# Patient Record
Sex: Male | Born: 1960 | Race: Black or African American | Hispanic: No | Marital: Married | Smoking: Never smoker
Health system: Southern US, Community
[De-identification: ages and names within clinical notes are randomized; demographics above are authoritative.]

## PROBLEM LIST (undated history)

## (undated) DIAGNOSIS — E669 Obesity, unspecified: Secondary | ICD-10-CM

## (undated) DIAGNOSIS — G4733 Obstructive sleep apnea (adult) (pediatric): Secondary | ICD-10-CM

## (undated) DIAGNOSIS — E7800 Pure hypercholesterolemia, unspecified: Secondary | ICD-10-CM

## (undated) DIAGNOSIS — R55 Syncope and collapse: Secondary | ICD-10-CM

## (undated) DIAGNOSIS — Z9989 Dependence on other enabling machines and devices: Secondary | ICD-10-CM

## (undated) DIAGNOSIS — I10 Essential (primary) hypertension: Secondary | ICD-10-CM

## (undated) HISTORY — PX: BACK SURGERY: SHX140

---

## 2019-02-27 ENCOUNTER — Other Ambulatory Visit (HOSPITAL_COMMUNITY): Payer: Medicare Other

## 2019-02-27 ENCOUNTER — Emergency Department (HOSPITAL_COMMUNITY): Payer: Medicare Other

## 2019-02-27 ENCOUNTER — Encounter (HOSPITAL_COMMUNITY): Payer: Self-pay | Admitting: Emergency Medicine

## 2019-02-27 ENCOUNTER — Other Ambulatory Visit: Payer: Self-pay

## 2019-02-27 ENCOUNTER — Inpatient Hospital Stay (HOSPITAL_COMMUNITY)
Admission: EM | Admit: 2019-02-27 | Discharge: 2019-03-02 | DRG: 280 | Disposition: A | Discharge status: Home / Self Care | Payer: Medicare Other | Attending: Cardiology | Admitting: Cardiology

## 2019-02-27 DIAGNOSIS — I200 Unstable angina: Secondary | ICD-10-CM

## 2019-02-27 DIAGNOSIS — E878 Other disorders of electrolyte and fluid balance, not elsewhere classified: Secondary | ICD-10-CM | POA: Diagnosis present

## 2019-02-27 DIAGNOSIS — I25110 Atherosclerotic heart disease of native coronary artery with unstable angina pectoris: Secondary | ICD-10-CM | POA: Diagnosis present

## 2019-02-27 DIAGNOSIS — R55 Syncope and collapse: Secondary | ICD-10-CM

## 2019-02-27 DIAGNOSIS — E876 Hypokalemia: Secondary | ICD-10-CM

## 2019-02-27 DIAGNOSIS — E669 Obesity, unspecified: Secondary | ICD-10-CM | POA: Diagnosis present

## 2019-02-27 DIAGNOSIS — I083 Combined rheumatic disorders of mitral, aortic and tricuspid valves: Secondary | ICD-10-CM | POA: Diagnosis present

## 2019-02-27 DIAGNOSIS — E860 Dehydration: Secondary | ICD-10-CM | POA: Diagnosis present

## 2019-02-27 DIAGNOSIS — I214 Non-ST elevation (NSTEMI) myocardial infarction: Principal | ICD-10-CM

## 2019-02-27 DIAGNOSIS — I9589 Other hypotension: Secondary | ICD-10-CM | POA: Diagnosis not present

## 2019-02-27 DIAGNOSIS — Z79899 Other long term (current) drug therapy: Secondary | ICD-10-CM

## 2019-02-27 DIAGNOSIS — R778 Other specified abnormalities of plasma proteins: Secondary | ICD-10-CM

## 2019-02-27 DIAGNOSIS — D649 Anemia, unspecified: Secondary | ICD-10-CM | POA: Diagnosis present

## 2019-02-27 DIAGNOSIS — Z6839 Body mass index (BMI) 39.0-39.9, adult: Secondary | ICD-10-CM

## 2019-02-27 DIAGNOSIS — I340 Nonrheumatic mitral (valve) insufficiency: Secondary | ICD-10-CM

## 2019-02-27 DIAGNOSIS — E870 Hyperosmolality and hypernatremia: Secondary | ICD-10-CM | POA: Diagnosis present

## 2019-02-27 DIAGNOSIS — R7989 Other specified abnormal findings of blood chemistry: Secondary | ICD-10-CM | POA: Diagnosis not present

## 2019-02-27 DIAGNOSIS — G4733 Obstructive sleep apnea (adult) (pediatric): Secondary | ICD-10-CM | POA: Diagnosis present

## 2019-02-27 DIAGNOSIS — E785 Hyperlipidemia, unspecified: Secondary | ICD-10-CM | POA: Diagnosis present

## 2019-02-27 DIAGNOSIS — Z7982 Long term (current) use of aspirin: Secondary | ICD-10-CM

## 2019-02-27 DIAGNOSIS — I959 Hypotension, unspecified: Secondary | ICD-10-CM | POA: Diagnosis present

## 2019-02-27 DIAGNOSIS — I5031 Acute diastolic (congestive) heart failure: Secondary | ICD-10-CM | POA: Diagnosis not present

## 2019-02-27 DIAGNOSIS — R197 Diarrhea, unspecified: Secondary | ICD-10-CM | POA: Diagnosis present

## 2019-02-27 DIAGNOSIS — R079 Chest pain, unspecified: Secondary | ICD-10-CM

## 2019-02-27 DIAGNOSIS — I110 Hypertensive heart disease with heart failure: Secondary | ICD-10-CM | POA: Diagnosis present

## 2019-02-27 DIAGNOSIS — Z79891 Long term (current) use of opiate analgesic: Secondary | ICD-10-CM

## 2019-02-27 DIAGNOSIS — Z7951 Long term (current) use of inhaled steroids: Secondary | ICD-10-CM | POA: Diagnosis not present

## 2019-02-27 DIAGNOSIS — I429 Cardiomyopathy, unspecified: Secondary | ICD-10-CM | POA: Diagnosis not present

## 2019-02-27 DIAGNOSIS — E861 Hypovolemia: Secondary | ICD-10-CM

## 2019-02-27 HISTORY — DX: Obstructive sleep apnea (adult) (pediatric): G47.33

## 2019-02-27 HISTORY — DX: Obesity, unspecified: E66.9

## 2019-02-27 HISTORY — DX: Pure hypercholesterolemia, unspecified: E78.00

## 2019-02-27 HISTORY — DX: Essential (primary) hypertension: I10

## 2019-02-27 HISTORY — DX: Dependence on other enabling machines and devices: Z99.89

## 2019-02-27 HISTORY — DX: Syncope and collapse: R55

## 2019-02-27 LAB — RETICULOCYTES
Immature Retic Fract: 6.4 % (ref 2.3–15.9)
RBC.: 4.15 MIL/uL — ABNORMAL LOW (ref 4.22–5.81)
Retic Count, Absolute: 66 10*3/uL (ref 19.0–186.0)
Retic Ct Pct: 1.6 % (ref 0.4–3.1)

## 2019-02-27 LAB — BASIC METABOLIC PANEL
Anion gap: 8 (ref 5–15)
BUN: 15 mg/dL (ref 6–20)
CO2: 26 mmol/L (ref 22–32)
Calcium: 8.9 mg/dL (ref 8.9–10.3)
Chloride: 108 mmol/L (ref 98–111)
Creatinine, Ser: 0.88 mg/dL (ref 0.61–1.24)
GFR calc Af Amer: >60 mL/min (ref >60)
GLUCOSE: 116 mg/dL — AB (ref 70–99)
Potassium: 3.5 mmol/L (ref 3.5–5.1)
Sodium: 142 mmol/L (ref 135–145)

## 2019-02-27 LAB — CBC WITH DIFFERENTIAL/PLATELET
Abs Immature Granulocytes: 0.05 10*3/uL (ref 0.00–0.07)
BASOS ABS: 0 10*3/uL (ref 0.0–0.1)
Basophils Relative: 1 %
EOS ABS: 0.1 10*3/uL (ref 0.0–0.5)
Eosinophils Relative: 2 %
HCT: 30.7 % — ABNORMAL LOW (ref 39.0–52.0)
Hemoglobin: 9.6 g/dL — ABNORMAL LOW (ref 13.0–17.0)
Immature Granulocytes: 1 %
Lymphocytes Relative: 19 %
Lymphs Abs: 1.3 10*3/uL (ref 0.7–4.0)
MCH: 27.6 pg (ref 26.0–34.0)
MCHC: 31.3 g/dL (ref 30.0–36.0)
MCV: 88.2 fL (ref 80.0–100.0)
Monocytes Absolute: 0.6 10*3/uL (ref 0.1–1.0)
Monocytes Relative: 8 %
NRBC: 0 % (ref 0.0–0.2)
Neutro Abs: 4.8 10*3/uL (ref 1.7–7.7)
Neutrophils Relative %: 69 %
Platelets: 170 10*3/uL (ref 150–400)
RBC: 3.48 MIL/uL — ABNORMAL LOW (ref 4.22–5.81)
RDW: 13.1 % (ref 11.5–15.5)
WBC: 6.9 10*3/uL (ref 4.0–10.5)

## 2019-02-27 LAB — URINALYSIS, ROUTINE W REFLEX MICROSCOPIC
Bilirubin Urine: NEGATIVE
Glucose, UA: NEGATIVE mg/dL
Hgb urine dipstick: NEGATIVE
Ketones, ur: NEGATIVE mg/dL
Leukocytes,Ua: NEGATIVE
Nitrite: NEGATIVE
PROTEIN: 100 mg/dL — AB
Specific Gravity, Urine: 1.03 (ref 1.005–1.030)
pH: 6 (ref 5.0–8.0)

## 2019-02-27 LAB — COMPREHENSIVE METABOLIC PANEL
ALT: 41 U/L (ref 0–44)
ANION GAP: 46 — AB (ref 5–15)
AST: 34 U/L (ref 15–41)
Albumin: 2.9 g/dL — ABNORMAL LOW (ref 3.5–5.0)
Alkaline Phosphatase: 50 U/L (ref 38–126)
BUN: 15 mg/dL (ref 6–20)
CO2: 17 mmol/L — ABNORMAL LOW (ref 22–32)
Calcium: 5.8 mg/dL — CL (ref 8.9–10.3)
Chloride: 94 mmol/L — ABNORMAL LOW (ref 98–111)
Creatinine, Ser: 0.8 mg/dL (ref 0.61–1.24)
GFR calc Af Amer: >60 mL/min (ref >60)
GFR calc non Af Amer: >60 mL/min (ref >60)
Glucose, Bld: 116 mg/dL — ABNORMAL HIGH (ref 70–99)
Potassium: 2.6 mmol/L — CL (ref 3.5–5.1)
Sodium: 157 mmol/L — ABNORMAL HIGH (ref 135–145)
TOTAL PROTEIN: 5 g/dL — AB (ref 6.5–8.1)
Total Bilirubin: 0.6 mg/dL (ref 0.3–1.2)

## 2019-02-27 LAB — CBC
HCT: 35.5 % — ABNORMAL LOW (ref 39.0–52.0)
Hemoglobin: 11.5 g/dL — ABNORMAL LOW (ref 13.0–17.0)
MCH: 27.7 pg (ref 26.0–34.0)
MCHC: 32.4 g/dL (ref 30.0–36.0)
MCV: 85.5 fL (ref 80.0–100.0)
NRBC: 0 % (ref 0.0–0.2)
Platelets: 231 10*3/uL (ref 150–400)
RBC: 4.15 MIL/uL — ABNORMAL LOW (ref 4.22–5.81)
RDW: 13.1 % (ref 11.5–15.5)
WBC: 8.6 10*3/uL (ref 4.0–10.5)

## 2019-02-27 LAB — TROPONIN I
Troponin I: 0.49 ng/mL (ref <0.03)
Troponin I: 1.74 ng/mL (ref <0.03)

## 2019-02-27 LAB — VITAMIN B12: VITAMIN B 12: 168 pg/mL — AB (ref 180–914)

## 2019-02-27 LAB — TSH: TSH: 0.931 u[IU]/mL (ref 0.350–4.500)

## 2019-02-27 LAB — ECHOCARDIOGRAM COMPLETE
Height: 65 in
Weight: 3680 oz

## 2019-02-27 LAB — HEPARIN LEVEL (UNFRACTIONATED): Heparin Unfractionated: 0.11 IU/mL — ABNORMAL LOW (ref 0.30–0.70)

## 2019-02-27 LAB — IRON AND TIBC
Iron: 114 ug/dL (ref 45–182)
Saturation Ratios: 49 % — ABNORMAL HIGH (ref 17.9–39.5)
TIBC: 231 ug/dL — ABNORMAL LOW (ref 250–450)
UIBC: 117 ug/dL

## 2019-02-27 LAB — FOLATE: FOLATE: 7 ng/mL (ref >5.9)

## 2019-02-27 LAB — I-STAT TROPONIN, ED: Troponin i, poc: 0.21 ng/mL (ref 0.00–0.08)

## 2019-02-27 LAB — MAGNESIUM: Magnesium: 1.6 mg/dL — ABNORMAL LOW (ref 1.7–2.4)

## 2019-02-27 LAB — CBG MONITORING, ED: GLUCOSE-CAPILLARY: 109 mg/dL — AB (ref 70–99)

## 2019-02-27 LAB — FERRITIN: Ferritin: 112 ng/mL (ref 24–336)

## 2019-02-27 MED ORDER — POTASSIUM CHLORIDE CRYS ER 20 MEQ PO TBCR
40.0000 meq | EXTENDED_RELEASE_TABLET | Freq: Once | ORAL | Status: AC
  Administered 2019-02-27: 40 meq via ORAL
  Filled 2019-02-27: qty 2

## 2019-02-27 MED ORDER — MAGNESIUM SULFATE 2 GM/50ML IV SOLN
2.0000 g | Freq: Once | INTRAVENOUS | Status: AC
  Administered 2019-02-27: 2 g via INTRAVENOUS
  Filled 2019-02-27: qty 50

## 2019-02-27 MED ORDER — SODIUM CHLORIDE 0.9% FLUSH: 3.0000 mL | INTRAVENOUS | Status: DC | PRN

## 2019-02-27 MED ORDER — SIMVASTATIN 20 MG PO TABS
20.0000 mg | ORAL_TABLET | Freq: Every day | ORAL | Status: DC
  Administered 2019-02-28 – 2019-03-01 (×2): 20 mg via ORAL
  Filled 2019-02-27 (×2): qty 1

## 2019-02-27 MED ORDER — SODIUM CHLORIDE 0.9 % IV BOLUS
500.0000 mL | Freq: Once | INTRAVENOUS | Status: AC
  Administered 2019-02-27: 500 mL via INTRAVENOUS

## 2019-02-27 MED ORDER — ASPIRIN 81 MG PO CHEW
324.0000 mg | CHEWABLE_TABLET | Freq: Once | ORAL | Status: AC
  Administered 2019-02-27: 324 mg via ORAL
  Filled 2019-02-27: qty 4

## 2019-02-27 MED ORDER — HEPARIN (PORCINE) 25000 UT/250ML-% IV SOLN
1450.0000 [IU]/h | INTRAVENOUS | Status: DC
  Administered 2019-02-27: 1200 [IU]/h via INTRAVENOUS
  Administered 2019-02-28: 1450 [IU]/h via INTRAVENOUS
  Filled 2019-02-27 (×2): qty 250

## 2019-02-27 MED ORDER — METOPROLOL TARTRATE 25 MG PO TABS: 25.0000 mg | ORAL_TABLET | Freq: Once | ORAL | Status: DC

## 2019-02-27 MED ORDER — ONDANSETRON HCL 4 MG/2ML IJ SOLN
4.0000 mg | Freq: Four times a day (QID) | INTRAMUSCULAR | Status: DC | PRN
  Administered 2019-03-01: 4 mg via INTRAVENOUS
  Filled 2019-02-27: qty 2

## 2019-02-27 MED ORDER — HEPARIN BOLUS VIA INFUSION
4000.0000 [IU] | Freq: Once | INTRAVENOUS | Status: AC
  Administered 2019-02-27: 4000 [IU] via INTRAVENOUS
  Filled 2019-02-27: qty 4000

## 2019-02-27 MED ORDER — SODIUM CHLORIDE 0.9 % IV SOLN
INTRAVENOUS | Status: DC
  Administered 2019-02-28: 05:00:00 via INTRAVENOUS

## 2019-02-27 MED ORDER — ACETAMINOPHEN 325 MG PO TABS
650.0000 mg | ORAL_TABLET | ORAL | Status: DC | PRN
  Administered 2019-02-28: 650 mg via ORAL
  Filled 2019-02-27: qty 2

## 2019-02-27 MED ORDER — SODIUM CHLORIDE 0.9% FLUSH
3.0000 mL | Freq: Two times a day (BID) | INTRAVENOUS | Status: DC
  Administered 2019-02-28 – 2019-03-01 (×3): 3 mL via INTRAVENOUS

## 2019-02-27 MED ORDER — SODIUM CHLORIDE 0.9 % IV SOLN: 250.0000 mL | INTRAVENOUS | Status: DC | PRN

## 2019-02-27 MED ORDER — FLUTICASONE PROPIONATE 50 MCG/ACT NA SUSP
2.0000 | Freq: Every day | NASAL | Status: DC
  Administered 2019-02-28 – 2019-03-02 (×3): 2 via NASAL
  Filled 2019-02-27: qty 16

## 2019-02-27 MED ORDER — SODIUM CHLORIDE 0.9% FLUSH: 3.0000 mL | Freq: Two times a day (BID) | INTRAVENOUS | Status: DC

## 2019-02-27 MED ORDER — POTASSIUM CHLORIDE CRYS ER 20 MEQ PO TBCR: 20.0000 meq | EXTENDED_RELEASE_TABLET | Freq: Once | ORAL | Status: DC

## 2019-02-27 MED ORDER — ASPIRIN EC 81 MG PO TBEC
81.0000 mg | DELAYED_RELEASE_TABLET | Freq: Every day | ORAL | Status: DC
  Administered 2019-02-28 – 2019-03-02 (×3): 81 mg via ORAL
  Filled 2019-02-27 (×3): qty 1

## 2019-02-27 MED ORDER — POTASSIUM CHLORIDE 10 MEQ/100ML IV SOLN
10.0000 meq | Freq: Once | INTRAVENOUS | Status: AC
  Administered 2019-02-27: 10 meq via INTRAVENOUS
  Filled 2019-02-27: qty 100

## 2019-02-27 MED ORDER — ASPIRIN 81 MG PO CHEW
81.0000 mg | CHEWABLE_TABLET | ORAL | Status: AC
  Administered 2019-02-28: 81 mg via ORAL
  Filled 2019-02-27: qty 1

## 2019-02-27 MED ORDER — ADULT MULTIVITAMIN W/MINERALS CH
1.0000 | ORAL_TABLET | Freq: Every day | ORAL | Status: DC
  Administered 2019-02-28 – 2019-03-02 (×3): 1 via ORAL
  Filled 2019-02-27 (×3): qty 1

## 2019-02-27 MED ORDER — ONDANSETRON HCL 4 MG/2ML IJ SOLN
4.0000 mg | Freq: Once | INTRAMUSCULAR | Status: AC
  Administered 2019-02-27: 4 mg via INTRAVENOUS
  Filled 2019-02-27: qty 2

## 2019-02-27 MED ORDER — VITAMIN D 25 MCG (1000 UNIT) PO TABS
1000.0000 [IU] | ORAL_TABLET | Freq: Every day | ORAL | Status: DC
  Administered 2019-02-28 – 2019-03-02 (×3): 1000 [IU] via ORAL
  Filled 2019-02-27 (×3): qty 1

## 2019-02-27 MED ORDER — SODIUM CHLORIDE 0.9 % IV BOLUS
1000.0000 mL | Freq: Once | INTRAVENOUS | Status: AC
  Administered 2019-02-27: 1000 mL via INTRAVENOUS

## 2019-02-27 NOTE — Progress Notes (Signed)
ANTICOAGULATION CONSULT NOTE  Pharmacy Consult for Heparin Indication: chest pain/ACS  No Known Allergies  Patient Measurements: Height: 5\' 5"  (165.1 cm) Weight: 244 lb 11.2 oz (111 kg) IBW/kg (Calculated) : 61.5 Heparin Dosing Weight:  85.2 kg  Vital Signs: Temp: 98 F (36.7 C) (03/08 2031) Temp Source: Oral (03/08 2031) BP: 112/62 (03/08 2031) Pulse Rate: 79 (03/08 2031)  Labs: Recent Labs    02/27/19 0952 02/27/19 1112 02/27/19 1836 02/27/19 2056  HGB 9.6*  --  11.5*  --   HCT 30.7*  --  35.5*  --   PLT 170  --  231  --   HEPARINUNFRC  --   --   --  0.11*  CREATININE 0.80 0.88  --   --   TROPONINI  --  0.49* 1.74*  --     Estimated Creatinine Clearance: 109.1 mL/min (by C-G formula based on SCr of 0.88 mg/dL).   Medical History: Past Medical History:  Diagnosis Date  . High cholesterol   . Hypertension   . Obesity   . Obstructive sleep apnea on CPAP   . Syncope    Assessment: 57 yom presenting with CP and syncope, + troponins. Of note, has hx of syncope, HTN, HLD, obesity, OSA   Significant events: Dehydration with Similar episode 9/19. Negative cardiac w/u at that time.  Heparin level tonight came back subtherapeutic at 0.11, on 1200 units/hr. Hgb 11.5, plt 231. Troponin increased from 0.21>0.49>1.74. No s/sx of bleeding. No infusion issues.   Goal of Therapy:  Heparin level 0.3-0.7 units/ml Monitor platelets by anticoagulation protocol: Yes   Plan:  Increase heparin infusion to 1450 units/hr Check heparin level in 6 hrs Daily HL and CBC  Sherron Monday, PharmD, BCCCP Clinical Pharmacist  Pager: [phone] Phone: [phone] 02/27/2019,9:49 PM

## 2019-02-27 NOTE — Consult Note (Deleted)
See admit note.

## 2019-02-27 NOTE — Progress Notes (Addendum)
   UPDATE:  Dr. Delton See reviewed echo, EF 40-45%. Given syncope, decreased EF and elevated troponin, she recommends to cancel plan for CT and instead proceed with LHC tomorrow. I called into patient's room and discussed echo results and recommendation for cath with him. Risks and benefits of cardiac catheterization have been discussed with the patient.  These include bleeding, infection, kidney damage, stroke, heart attack, death. The patient understands these risks and is willing to proceed. He denies any known allergies. Given LV dysfunction, EF 40-45% and having already received IV fluids, will give 75cc/hr NS starting tomorrow AM with cath orders. I wrote his name on our add-on board so timing of cath will be determined tomorrow. OK to eat tonight. Also updated nurse of plan.  Dayna Dunn PA-C

## 2019-02-27 NOTE — H&P (Addendum)
It appears ED order for internal medicine to admit was cancelled and Dr.Nelson is now listed as attending. We were initially unaware we were consulting as IM was being asked to admit. Spoke with Dr. Jeraldine Loots to clarify, unclear how IM admit order got discontinued. We will go ahead and admit to our service. See Consult note which will serve as this pt's H/P. Dr. Delton See also recommends to supplement K and Mag (see orders). She is unable to do CT today as pt's BP prohibits BB/NTG for test but she plans to discuss with rounding team tomorrow to hopefully facilitate since his antihypertensives are being held. She also ordered additional IV fluid. Will continue to cycle troponins and also obtain repeat CBC with next labs. Add anemia panel. Dayna Dunn PA-C

## 2019-02-27 NOTE — Progress Notes (Signed)
Echocardiogram 2D Echocardiogram has been performed.  Pieter Partridge 02/27/2019, 3:48 PM

## 2019-02-27 NOTE — ED Notes (Signed)
ED TO INPATIENT HANDOFF REPORT  ED Nurse Name and Phone #: Lorin Picket [phone]  S Name/Age/Gender Derrick Stark 58 y.o. male Room/Bed: 028C/028C  Code Status   Code Status: Not on file  Home/SNF/Other Home Patient oriented to: self, place, time and situation Is this baseline? Yes   Triage Complete: Triage complete  Chief Complaint syncope/weakness  [place] [facility] [place] here from [place] with kids basketball tournament.  Felt faint feeling and dizzy, sat on a bench, passed out- states that he had chest pain prior to syncopal episode. Had a similar episode on football field in September, states was dehydrated.  On arrival pt denies any chest pain, c/o nausea-- has not eaten breakfast today.    Allergies No Known Allergies  Level of Care/Admitting Diagnosis ED Disposition    ED Disposition Condition Comment   Admit  Hospital Area: [facility] [[id number]]  Level of Care: Progressive [102]  Diagnosis: Syncope [206001]  Admitting Physician: Lars Masson [[id number]]  Attending Physician: Lars Masson [[id number]]  Estimated length of stay: past midnight tomorrow  Certification:: I certify this patient will need inpatient services for at least 2 midnights  PT Class (Do Not Modify): Inpatient [101]  PT Acc Code (Do Not Modify): Private [1]       B Medical/Surgery History Past Medical History:  Diagnosis Date  . High cholesterol   . Hypertension   . Obesity   . Obstructive sleep apnea on CPAP   . Syncope    Past Surgical History:  Procedure Laterality Date  . BACK SURGERY       A IV Location/Drains/Wounds Patient Lines/Drains/Airways Status   Active Line/Drains/Airways    Name:   Placement date:   Placement time:   Site:   Days:   Peripheral IV 02/27/19 Left Antecubital   02/27/19    0918    Antecubital   less than 1          Intake/Output Last 24 hours No intake or output data in the 24 hours ending  02/27/19 1638  Labs/Imaging Results for orders placed or performed during the hospital encounter of 02/27/19 (from the past 48 hour(s))  CBC with Differential     Status: Abnormal   Collection Time: 02/27/19  9:52 AM  Result Value Ref Range   WBC 6.9 4.0 - 10.5 K/uL   RBC 3.48 (L) 4.22 - 5.81 MIL/uL   Hemoglobin 9.6 (L) 13.0 - 17.0 g/dL   HCT 22.2 (L) 97.9 - 89.2 %   MCV 88.2 80.0 - 100.0 fL   MCH 27.6 26.0 - 34.0 pg   MCHC 31.3 30.0 - 36.0 g/dL   RDW 11.9 41.7 - 40.8 %   Platelets 170 150 - 400 K/uL   nRBC 0.0 0.0 - 0.2 %   Neutrophils Relative % 69 %   Neutro Abs 4.8 1.7 - 7.7 K/uL   Lymphocytes Relative 19 %   Lymphs Abs 1.3 0.7 - 4.0 K/uL   Monocytes Relative 8 %   Monocytes Absolute 0.6 0.1 - 1.0 K/uL   Eosinophils Relative 2 %   Eosinophils Absolute 0.1 0.0 - 0.5 K/uL   Basophils Relative 1 %   Basophils Absolute 0.0 0.0 - 0.1 K/uL   Immature Granulocytes 1 %   Abs Immature Granulocytes 0.05 0.00 - 0.07 K/uL    Comment: Performed at [facility], [address] [place], [place] [address]  Comprehensive metabolic panel     Status:  Abnormal   Collection Time: 02/27/19  9:52 AM  Result Value Ref Range   Sodium 157 (H) 135 - 145 mmol/L   Potassium 2.6 (LL) 3.5 - 5.1 mmol/L    Comment: CRITICAL RESULT CALLED TO, READ BACK BY AND VERIFIED WITH: MCORD, S RN @ 1049 ON 02/27/2019 BY TEMOCHE,H    Chloride 94 (L) 98 - 111 mmol/L   CO2 17 (L) 22 - 32 mmol/L   Glucose, Bld 116 (H) 70 - 99 mg/dL   BUN 15 6 - 20 mg/dL   Creatinine, Ser 1.61 0.61 - 1.24 mg/dL   Calcium 5.8 (LL) 8.9 - 10.3 mg/dL    Comment: CRITICAL RESULT CALLED TO, READ BACK BY AND VERIFIED WITH: MCORD, S RN @ 1049 ON 02/27/2019 BY TEMOCHE,H    Total Protein 5.0 (L) 6.5 - 8.1 g/dL   Albumin 2.9 (L) 3.5 - 5.0 g/dL   AST 34 15 - 41 U/L   ALT 41 0 - 44 U/L   Alkaline Phosphatase 50 38 - 126 U/L   Total Bilirubin 0.6 0.3 - 1.2 mg/dL   GFR calc non Af Amer >60 >60 mL/min   GFR calc Af Amer >60 >60  mL/min   Anion gap 46 (H) 5 - 15    Comment: Performed at [facility], [address] [place], [place] [address]  I-Stat Troponin, ED (not at [facility])     Status: Abnormal   Collection Time: 02/27/19  9:53 AM  Result Value Ref Range   Troponin i, poc 0.21 (HH) 0.00 - 0.08 ng/mL   Comment NOTIFIED PHYSICIAN    Comment 3            Comment: Due to the release kinetics of cTnI, a negative result within the first hours of the onset of symptoms does not rule out myocardial infarction with certainty. If myocardial infarction is still suspected, repeat the test at appropriate intervals.   POC CBG, ED     Status: Abnormal   Collection Time: 02/27/19 10:53 AM  Result Value Ref Range   Glucose-Capillary 109 (H) 70 - 99 mg/dL  Basic metabolic panel     Status: Abnormal   Collection Time: 02/27/19 11:12 AM  Result Value Ref Range   Sodium 142 135 - 145 mmol/L    Comment: REPEATED TO VERIFY DELTA CHECK NOTED    Potassium 3.5 3.5 - 5.1 mmol/L    Comment: REPEATED TO VERIFY DELTA CHECK NOTED    Chloride 108 98 - 111 mmol/L   CO2 26 22 - 32 mmol/L   Glucose, Bld 116 (H) 70 - 99 mg/dL   BUN 15 6 - 20 mg/dL   Creatinine, Ser 5.40 0.61 - 1.24 mg/dL   Calcium 8.9 8.9 - 98.1 mg/dL    Comment: REPEATED TO VERIFY DELTA CHECK NOTED    GFR calc non Af Amer >60 >60 mL/min   GFR calc Af Amer >60 >60 mL/min   Anion gap 8 5 - 15    Comment: Performed at [facility], [address] [place], [place] [address]  Troponin I - ONCE - STAT     Status: Abnormal   Collection Time: 02/27/19 11:12 AM  Result Value Ref Range   Troponin I 0.49 (HH) <0.03 ng/mL    Comment: CRITICAL RESULT CALLED TO, READ BACK BY AND VERIFIED WITH: S.Jaydyn Bozzo,RN @ 1215 02/27/2019 WEBBERJ Performed at [facility], [address], [place], [place] [address]   Magnesium  Status: Abnormal   Collection Time: 02/27/19 11:12 AM  Result Value Ref Range   Magnesium 1.6 (L) 1.7 - 2.4 mg/dL    Comment: Performed  at [facility], [address] [place], [place] [address]  Urinalysis, Routine w reflex microscopic     Status: Abnormal   Collection Time: 02/27/19 11:42 AM  Result Value Ref Range   Color, Urine YELLOW YELLOW   APPearance HAZY (A) CLEAR   Specific Gravity, Urine 1.030 1.005 - 1.030   pH 6.0 5.0 - 8.0   Glucose, UA NEGATIVE NEGATIVE mg/dL   Hgb urine dipstick NEGATIVE NEGATIVE   Bilirubin Urine NEGATIVE NEGATIVE   Ketones, ur NEGATIVE NEGATIVE mg/dL   Protein, ur 604100 (A) NEGATIVE mg/dL   Nitrite NEGATIVE NEGATIVE   Leukocytes,Ua NEGATIVE NEGATIVE   RBC / HPF 0-5 0 - 5 RBC/hpf   WBC, UA 0-5 0 - 5 WBC/hpf   Bacteria, UA FEW (A) NONE SEEN   Mucus PRESENT    Hyaline Casts, UA PRESENT    Granular Casts, UA PRESENT    Sperm, UA PRESENT     Comment: Performed at [facility], [address] [place], [place] [address]   Dg Chest 2 View  Result Date: 02/27/2019 CLINICAL DATA:  Syncope and chest pain EXAM: CHEST - 2 VIEW COMPARISON:  None. FINDINGS: Low volume chest with mild interstitial crowding/hazy density at the bases. There is no edema, consolidation, effusion, or pneumothorax. Normal heart size and mediastinal contours. No acute osseous finding IMPRESSION: Low volume chest with presumed mild atelectasis. Electronically Signed   By: Marnee SpringJonathon  Watts M.D.   On: 02/27/2019 10:43    Pending Labs Unresulted Labs (From admission, onward)    Start     Ordered   02/28/19 0500  Lipid panel  Tomorrow morning,   R     02/27/19 1236   02/28/19 0500  Heparin level (unfractionated)  Daily,   R     02/27/19 1251   02/28/19 0500  CBC  Daily,   R     02/27/19 1251   02/27/19 2100  Heparin level (unfractionated)  Once-Timed,   R     02/27/19 1251   02/27/19 1104  Calcium, ionized  Once,   R     02/27/19 1103   Unscheduled  Occult blood card to lab, stool RN will collect  As needed,   R    Question:  Specimen to be collected by?  Answer:  RN will collect   02/27/19 1135   Signed  and Held  HIV antibody (Routine Testing)  Once,   R     Signed and Held   Signed and Held  TSH  Once,   R     Signed and Held   Signed and Held  Troponin I - Now Then Q6H  Now then every 6 hours,   STAT     Signed and Held   Signed and Held  Magnesium  Daily,   R     Signed and Held   Signed and Armed forces training and education officerHeld  Basic metabolic panel  Tomorrow morning,   R     Signed and Held   Signed and Held  CBC  Tomorrow morning,   R     Signed and Held   Signed and Held  Vitamin B12  (Anemia Panel (PNL))  Once,   R     Signed and Held   Signed and Held  Folate  (Anemia Panel (PNL))  Once,   R     Signed and Held   Signed and Held  Iron and TIBC  (Anemia Panel (PNL))  Once,   R     Signed and Held   Signed and Held  Ferritin  (Anemia Panel (PNL))  Once,   R     Signed and Held   Signed and Held  Reticulocytes  (Anemia Panel (PNL))  Once,   R     Signed and Held   Signed and Held  CBC  Once,   R     Signed and Held          Vitals/Pain Today's Vitals   02/27/19 1330 02/27/19 1340 02/27/19 1350 02/27/19 1400  BP: (!) 98/52 (!) 93/44 (!) 95/59 103/61  Pulse: 84 79 84 87  Resp: (!) 0 10 14 12   Temp:      TempSrc:      SpO2: 94% 96% 100% 99%  Weight:      Height:      PainSc:        Isolation Precautions No active isolations  Medications Medications  aspirin EC tablet 81 mg (has no administration in time range)  heparin ADULT infusion 100 units/mL (25000 units/210mL sodium chloride 0.45%) (1,200 Units/hr Intravenous New Bag/Given 02/27/19 1259)  magnesium sulfate IVPB 2 g 50 mL (has no administration in time range)  potassium chloride SA (K-DUR,KLOR-CON) CR tablet 40 mEq (has no administration in time range)  sodium chloride 0.9 % bolus 1,000 mL (0 mLs Intravenous Stopped 02/27/19 1130)  ondansetron (ZOFRAN) injection 4 mg (4 mg Intravenous Given 02/27/19 0948)  potassium chloride 10 mEq in 100 mL IVPB (0 mEq Intravenous Stopped 02/27/19 1239)  aspirin chewable tablet 324 mg (324 mg Oral Given  02/27/19 1256)  heparin bolus via infusion 4,000 Units (4,000 Units Intravenous Bolus from Bag 02/27/19 1258)  sodium chloride 0.9 % bolus 500 mL (500 mLs Intravenous New Bag/Given 02/27/19 1345)    Mobility walks     Focused Assessments Cardiac Assessment Handoff:  Cardiac Rhythm: Normal sinus rhythm Lab Results  Component Value Date   TROPONINI 0.49 (HH) 02/27/2019   No results found for: DDIMER Does the Patient currently have chest pain? No     R Recommendations: See Admitting Provider Note  Report given to:   Additional Notes:

## 2019-02-27 NOTE — H&P (Addendum)
Cardiology H&P:   Patient ID: Derrick Stark; [id number]; [birth year]   Admit date: 02/27/2019 Date of Consult: 02/27/2019  Primary Care Provider: System, Pcp Not In Primary Cardiologist: No primary care provider on file. - Patient is from [place], [place] Primary Electrophysiologist:  None  Chief Complaint: chest tightness, passed out  Patient Profile:   Derrick Stark is a 58 y.o. male with a hx of syncope, HTN, HLD, obesity, OSA who is being seen today for the evaluation of syncope, chest tightness and elevated troponin at the request of Dr. Jeraldine Loots.  History of Present Illness:   Derrick Stark is here for a [employer] and passed out at his hotel today. He lives in [place]. He reports that in September 2019 he had a similar episode of dizziness then syncope with reported negative cardiac workup at that time. He recalls having a heart cath and echo and being told everything was normal. He was told he was extremely dehydrated and advised to do PT and f/u with his PCP. He did not have any cardiac follow-up recommended. He has done well since then and is pretty active. 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 Yesterday he attended a daylong basketball session in the gym with little water, only a few soft drinks. This morning he walked out to his car and suddenly felt some substernal chest tightness and SOB. He felt like he needed to sit down and so went to sit down on a bench where chest discomfort felt better but that's the last thing he remembers until he woke up with people around him. No b/b incontinence reported. He is not sure how long he was out for. He did not require any CPR or defibrillation. Per EMS run sheet, "Bystanders are assistance coaches who are traveling with PT, in town for a basketball tournament. Bystanders deny PT fell or suffered trauma of any kind." They reported he  was out for 2-3 minutes. He has been nauseous since regaining consciousness. He was reportedly hypotensive and received 250cc bolus with run sheet showing first BP of 118/77. His initial BP here was 91/63. His initial EKG showed by EMS showed NSR with downsloped ST segment depression inferiorly as well as V3-V6, much more pronounced than our ER tracing, QTc (see Chart Review -> Media -> EMS Run Sheet) - f/u tracing here shows NSR with borderline inferior ST sagging and V6, QTc .  Initial labs show hypernatremia of 157, hypokalemia of 2.6, choride of 94, hypocalcemia of 5.8, CO2 17, albumin of 2.9, normocytic anemia with Hgb 9.6, POC troponin of 0.21. We don't have any old labs or records to compare to except a few old ortho notes and a Hgb of 13.2 in 2015. He denies any known bleeding. CXR shows low volume chest with presumed mild atelectasis but normal heart size and mediastinal contours. He is currently feeling well, just nauseated. No family history of heart disease or sudden cardiac death.  Past Medical History:  Diagnosis Date  . High cholesterol   . Hypertension   . Obesity   . Obstructive sleep apnea on CPAP   . Syncope     Past Surgical History:  Procedure Laterality Date  . BACK SURGERY       Inpatient Medications: Scheduled Meds:  Continuous Infusions: . potassium chloride     PRN Meds:   Home Meds: losartan, HCTZ, statin - doses pending clarification  Allergies:   NKDA  Social History:   Social History   Socioeconomic History  . Marital status: Married    Spouse name: Not on file  . Number of children: Not on file  . Years of education: Not on file  . Highest education level: Not on file  Occupational History  . Not on file  Social Needs  . Financial resource strain: Not on file  . Food insecurity:    Worry: Not on file    Inability: Not on file  . Transportation needs:    Medical: Not on file    Non-medical: Not on file  Tobacco Use  .  Smoking status: Never Smoker  . Smokeless tobacco: Never Used  Substance and Sexual Activity  . Alcohol use: Yes    Comment: seldom  . Drug use: Never  . Sexual activity: Not on file  Lifestyle  . Physical activity:    Days per week: Not on file    Minutes per session: Not on file  . Stress: Not on file  Relationships  . Social connections:    Talks on phone: Not on file    Gets together: Not on file    Attends religious service: Not on file    Active member of club or organization: Not on file    Attends meetings of clubs or organizations: Not on file    Relationship status: Not on file  . Intimate partner violence:    Fear of current or ex partner: Not on file    Emotionally abused: Not on file    Physically abused: Not on file    Forced sexual activity: Not on file  Other Topics Concern  . Not on file  Social History Narrative  . Not on file    Family History:   The patient's family history is negative for CAD and Sudden Cardiac Death.  ROS:  Please see the history of present illness.  All other ROS reviewed and negative.     Physical Exam/Data:   Vitals:   02/27/19 0920 02/27/19 0940 02/27/19 1042 02/27/19 1100  BP: 91/63 (!) 92/45 92/60 100/72  Pulse: 76 76 84 81  Resp:  (!) 5 13 (!) 24  Temp: 97.8 F (36.6 C)     TempSrc: Oral     SpO2: 99% 99% 99% 100%  Weight: 104.3 kg     Height: 5\' 5"  (1.651 m)      No intake or output data in the 24 hours ending 02/27/19 1136 Last 3 Weights 02/27/2019  Weight (lbs) 230 lb  Weight (kg) 104.327 kg    Body mass index is 38.27 kg/m.  General: Well developed, well nourished smiling AAM, in no acute distress. Head: Normocephalic, atraumatic, sclera non-icteric, no xanthomas, nares are without discharge.  Neck: Negative for carotid bruits. JVD not elevated. No subclavian bruits. Lungs: Clear bilaterally to auscultation without wheezes, rales, or rhonchi. Breathing is unlabored. Heart: RRR with S1 S2. No murmurs, rubs,  or gallops appreciated. Abdomen: Soft, non-tender, non-distended with normoactive bowel sounds. No hepatomegaly. No rebound/guarding. No obvious abdominal masses. Msk:  Strength and tone appear normal for age. Extremities: No clubbing or cyanosis. No edema.  Distal pedal pulses are 2+ and equal bilaterally - no pulse discrepancies. Neuro: Alert and oriented X 3. No facial asymmetry. No focal deficit. Moves all extremities spontaneously. Psych:  Responds to questions appropriately with a normal affect.  EKG:  The EKG was personally reviewed. His initial EKG showed by EMS showed  NSR with downsloped ST segment depression inferiorly as well as V3-V6, much more pronounced than our ER tracing, QTc - f/u tracing here shows NSR with borderline inferior ST sagging and V6, QTc .  Laboratory Data:  Chemistry Recent Labs  Lab 02/27/19 0952  NA 157*  K 2.6*  CL 94*  CO2 17*  GLUCOSE 116*  BUN 15  CREATININE 0.80  CALCIUM 5.8*  GFRNONAA >60  GFRAA >60  ANIONGAP 46*    Recent Labs  Lab 02/27/19 0952  PROT 5.0*  ALBUMIN 2.9*  AST 34  ALT 41  ALKPHOS 50  BILITOT 0.6   Hematology Recent Labs  Lab 02/27/19 0952  WBC 6.9  RBC 3.48*  HGB 9.6*  HCT 30.7*  MCV 88.2  MCH 27.6  MCHC 31.3  RDW 13.1  PLT 170    Recent Labs  Lab 02/27/19 0953  TROPIPOC 0.21*     Radiology/Studies:  Dg Chest 2 View  Result Date: 02/27/2019 CLINICAL DATA:  Syncope and chest pain EXAM: CHEST - 2 VIEW COMPARISON:  None. FINDINGS: Low volume chest with mild interstitial crowding/hazy density at the bases. There is no edema, consolidation, effusion, or pneumothorax. Normal heart size and mediastinal contours. No acute osseous finding IMPRESSION: Low volume chest with presumed mild atelectasis. Electronically Signed   By: Marnee Spring M.D.   On: 02/27/2019 10:43    Assessment and Plan:   1. Chest tightness/syncope with abnormal EKG and elevated point-of-care troponin - story concerning for  cardiac etiology of syncope, especially now that he has had a 2nd recurrence with minimal warning. However, both episodes have occurred under circumstances which may pre-dispose him to dehydration in the setting of ongoing therapy with losartan/HCTZ. He was in a gym doing basketball with kids all day yesterday with minimal fluid intake. Will place order to obtain records from [facility] for review as he reportedly had a normal cath just 6 months ago for similar reasons. Obtain echocardiogram - Dr. Delton See plans to call them to have done today. Would recommend to continue to cycle troponins with admit labs - 2nd set has come back at 0.49 and Dr. Delton See recommends to give aspirin and heparin. If inpatient eval is unrevealing would recommend EP eval to decide loop recorder or at least 30 day event monitor to start. Needs w/u of anemia as well. Will review further with MD, including whether CT to exclude dissection vs PE is warranted. Pt is currently not describing any further CP. Hold home antihypertensives.  2. Profound electrolyte disturbances with hypernatremia, hypokalemia, hypochloremia and hypocalcemia - repeat labs pending. ? Lab error - does not fit how he currently appears.  3. Normocytic anemia - no bleeding reported, question contributing to #1. Check hemoccult. Will need further eval. Interestingly if he were dehydrated would expect this to be higher.  4. H/o HTN - hold home antihypertensives.  5. Hyperlipidemia - check lipids with AM labs. Would continue statin with admission labs.  For questions or updates, please contact [facility] Please consult [email or web address] for contact info under Cardiology/STEMI.   Signed, Laurann Montana, PA-C  02/27/2019 11:36 AM  The patient was seen, examined and discussed with Ronie Spies, PA-C and I agree with the above.   A very pleasant 58 y.o. 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 6 months ago a syncope -  admitted to [facility], per patient normal cardiac cath and normal echocardiogram. No symptoms since then, no CP, exertional dyspnea, no dizziness, palpitations, insignificant FH of CAD (mom has stents at age 56). Yesterday he spent his day on a basketball court with minimal food and fluids intake, no fever, chills, vomiting, 1 episode of diarrhea this am.   When carrying bags this am developed CP, SOB, sat on a bench and passed out.  On admission to the ER ECG shows SR, non-specific ST T Wave abnormalities, CXR normal, physical exam is unremarkable except for low BP 78/56 mmHg. Labs abnormal: troponin 0.21->0.49,  With metabolic disarray, but repeat labs show normal Na, hypokalemia, hypomagnesemia.   He is currently asymptomatic.  Plan: Obtain urgent echocardiogram Start Heparin drip Continue cycling troponin Replace K and Mg Obtain records from [facility] Order coronary CTA  Given that he had normal coronaries  6 months ago this might possibly be secondary to arrhythmias rather than CAD. I was trying to perform coronary CTA, however with hypotension, I am unable to administer metoprolol or NTG. I hav ereviewed his echo and his LVEF is mildly decreased estimated at 45-50% with diffuse hypokinesis. We will plan for a cath in the am, if normal  We will ask EP for a consultation.  No family history of heart disease or sudden cardiac death.  Tobias AlexanderKatarina Nelson, MD 02/27/2019

## 2019-02-27 NOTE — ED Provider Notes (Signed)
[facility] EMERGENCY DEPARTMENT Provider Note   CSN: [id number] Arrival date & time: 02/27/19  8502    History   Chief Complaint Chief Complaint  Patient presents with  . Loss of Consciousness    HPI Derrick Stark is a 58 y.o. male.     58 y/o male with a PMH of HTN, HLD presents to the ED s/p syncopal episode. Patient is here for a basketball tournament, reports he woke up today and started to feel a bit dizzy along with a tight chest pressure when he states he walked outside to get fresh air and then sat on a bench.  This is the last recollection patient has of this incident.  According to hotel staff patient was found sitting on chair outside.  He reports a similar episode years back due to dehydration.  Hypotensive on arrival, given 200 mL's per EMS.  Blood pressure has seemed to improved.  He reports "I feel like I could not breathe "during this episode.  Also endorses some nausea. Denies any fever, abdominal pain, urinary symptoms or headache.     Past Medical History:  Diagnosis Date  . High cholesterol   . Hypertension   . Obesity   . Obstructive sleep apnea on CPAP   . Syncope     Patient Active Problem List   Diagnosis Date Noted  . Near syncope   . Hypokalemia   . Unstable angina pectoris (HCC)   . Syncope 02/27/2019  . Elevated troponin 02/27/2019  . Hypotension 02/27/2019    Past Surgical History:  Procedure Laterality Date  . BACK SURGERY    . LEFT HEART CATH AND CORONARY ANGIOGRAPHY N/A 02/28/2019   Procedure: LEFT HEART CATH AND CORONARY ANGIOGRAPHY;  Surgeon: Lyn Records, MD;  Location: [facility] INVASIVE CV LAB;  Service: Cardiovascular;  Laterality: N/A;        Home Medications    Prior to Admission medications   Medication Sig Start Date End Date Taking? Authorizing Provider  aspirin EC 81 MG tablet Take 81 mg by mouth daily.   Yes [provider]  cholecalciferol (VITAMIN D3) 25 MCG (1000 UT) tablet Take 1,000 Units  by mouth daily.   Yes [provider]  fluticasone (FLONASE) 50 MCG/ACT nasal spray Place 2 sprays into both nostrils daily.   Yes [provider]  hydrochlorothiazide (HYDRODIURIL) 25 MG tablet Take 25 mg by mouth daily. 01/24/19  Yes [provider]  HYDROcodone-acetaminophen (NORCO) 10-325 MG tablet Take 1 tablet by mouth every 8 (eight) hours as needed for pain. for pain 10/22/18  Yes [provider]  losartan (COZAAR) 100 MG tablet Take 100 mg by mouth daily. 01/25/19  Yes [provider]  Misc. Devices MISC 1 each by Does not apply route at bedtime. C-PAP   Yes [provider]  Multiple Vitamins-Minerals (MULTIVITAMIN WITH MINERALS) tablet Take 1 tablet by mouth daily.   Yes [provider]  simvastatin (ZOCOR) 20 MG tablet Take 20 mg by mouth daily. 01/07/19  Yes [provider]    Family History Family History  Problem Relation Age of Onset  . CAD Neg Hx   . Sudden Cardiac Death Neg Hx     Social History Social History   Tobacco Use  . Smoking status: Never Smoker  . Smokeless tobacco: Never Used  Substance Use Topics  . Alcohol use: Yes    Comment: seldom  . Drug use: Never     Allergies   Patient  has no known allergies.   Review of Systems Review of Systems  Constitutional: Negative for chills and fever.  HENT: Negative for ear pain and sore throat.   Eyes: Negative for pain and visual disturbance.  Respiratory: Positive for shortness of breath. Negative for cough.   Cardiovascular: Positive for chest pain. Negative for palpitations.  Gastrointestinal: Negative for abdominal pain and vomiting.  Genitourinary: Negative for dysuria and hematuria.  Musculoskeletal: Negative for arthralgias and back pain.  Skin: Negative for color change and rash.  Neurological: Positive for dizziness and syncope. Negative for seizures.  All other systems reviewed and are negative.    Physical Exam Updated Vital  Signs BP 106/71   Pulse 80   Temp (!) 97.5 F (36.4 C) (Oral)   Resp (!) 21   Ht  (1.651 m)   Wt 108 kg   SpO2 100%   BMI 39.62 kg/m   Physical Exam Vitals signs and nursing note reviewed.  Constitutional:      Appearance: He is well-developed. He is not ill-appearing.  HENT:     Head: Normocephalic and atraumatic.  Eyes:     General: No scleral icterus.    Pupils: Pupils are equal, round, and reactive to light.  Neck:     Musculoskeletal: Normal range of motion. No neck rigidity.  Cardiovascular:     Heart sounds: Normal heart sounds.  Pulmonary:     Effort: Pulmonary effort is normal.     Breath sounds: Normal breath sounds. No wheezing.  Chest:     Chest wall: No tenderness.  Abdominal:     General: Bowel sounds are normal. There is no distension.     Palpations: Abdomen is soft.     Tenderness: There is no abdominal tenderness.  Musculoskeletal:        General: No tenderness or deformity.  Skin:    General: Skin is warm and dry.  Neurological:     Mental Status: He is alert and oriented to person, place, and time.      ED Treatments / Results  Labs (all labs ordered are listed, but only abnormal results are displayed) Labs Reviewed  CBC WITH DIFFERENTIAL/PLATELET - Abnormal; Notable for the following components:      Result Value   RBC 3.48 (*)    Hemoglobin 9.6 (*)    HCT 30.7 (*)    All other components within normal limits  COMPREHENSIVE METABOLIC PANEL - Abnormal; Notable for the following components:   Sodium 157 (*)    Potassium 2.6 (*)    Chloride 94 (*)    CO2 17 (*)    Glucose, Bld 116 (*)    Calcium 5.8 (*)    Total Protein 5.0 (*)    Albumin 2.9 (*)    Anion gap 46 (*)    All other components within normal limits  URINALYSIS, ROUTINE W REFLEX MICROSCOPIC - Abnormal; Notable for the following components:   APPearance HAZY (*)    Protein, ur 100 (*)    Bacteria, UA FEW (*)    All other components within normal limits  BASIC  METABOLIC PANEL - Abnormal; Notable for the following components:   Glucose, Bld 116 (*)    All other components within normal limits  TROPONIN I - Abnormal; Notable for the following components:   Troponin I 0.49 (*)    All other components within normal limits  MAGNESIUM - Abnormal; Notable for the following components:   Magnesium 1.6 (*)  All other components within normal limits  HEPARIN LEVEL (UNFRACTIONATED) - Abnormal; Notable for the following components:   Heparin Unfractionated 0.11 (*)    All other components within normal limits  LIPID PANEL - Abnormal; Notable for the following components:   HDL 35 (*)    All other components within normal limits  HEPARIN LEVEL (UNFRACTIONATED) - Abnormal; Notable for the following components:   Heparin Unfractionated 0.18 (*)    All other components within normal limits  CBC - Abnormal; Notable for the following components:   RBC 4.15 (*)    Hemoglobin 11.5 (*)    HCT 36.0 (*)    All other components within normal limits  TROPONIN I - Abnormal; Notable for the following components:   Troponin I 1.74 (*)    All other components within normal limits  TROPONIN I - Abnormal; Notable for the following components:   Troponin I 1.26 (*)    All other components within normal limits  TROPONIN I - Abnormal; Notable for the following components:   Troponin I 1.01 (*)    All other components within normal limits  BASIC METABOLIC PANEL - Abnormal; Notable for the following components:   Calcium 8.6 (*)    All other components within normal limits  VITAMIN B12 - Abnormal; Notable for the following components:   Vitamin B-12 168 (*)    All other components within normal limits  IRON AND TIBC - Abnormal; Notable for the following components:   TIBC 231 (*)    Saturation Ratios 49 (*)    All other components within normal limits  RETICULOCYTES - Abnormal; Notable for the following components:   RBC. 4.15 (*)    All other components within  normal limits  CBC - Abnormal; Notable for the following components:   RBC 4.15 (*)    Hemoglobin 11.5 (*)    HCT 35.5 (*)    All other components within normal limits  CBC - Abnormal; Notable for the following components:   RBC 3.96 (*)    Hemoglobin 11.3 (*)    HCT 34.1 (*)    All other components within normal limits  BASIC METABOLIC PANEL - Abnormal; Notable for the following components:   Potassium 3.2 (*)    Calcium 8.8 (*)    All other components within normal limits  BRAIN NATRIURETIC PEPTIDE - Abnormal; Notable for the following components:   B Natriuretic Peptide 196.3 (*)    All other components within normal limits  I-STAT TROPONIN, ED - Abnormal; Notable for the following components:   Troponin i, poc 0.21 (*)    All other components within normal limits  CBG MONITORING, ED - Abnormal; Notable for the following components:   Glucose-Capillary 109 (*)    All other components within normal limits  MRSA PCR SCREENING  CALCIUM, IONIZED  HIV ANTIBODY (ROUTINE TESTING W REFLEX)  TSH  MAGNESIUM  FOLATE  FERRITIN  HEPARIN LEVEL (UNFRACTIONATED)  MAGNESIUM    EKG EKG Interpretation  Date/Time:  Sunday February 27 2019 09:16:14 EDT Ventricular Rate:  76 PR Interval:    QRS Duration: 101 QT Interval:  417 QTC Calculation: 469 R Axis:   51 Text Interpretation:  Sinus rhythm Borderline T wave abnormalities No old tracing to compare Confirmed by Shaune Pollack [phone]) on 02/28/2019 7:54:35 PM   Radiology No results found.  Procedures .Critical Care Performed by: Claude Manges, PA-C Authorized by: Claude Manges, PA-C   Critical care provider statement:    Critical care time (  minutes):  45   Critical care start time:  02/27/2019 9:00 AM   Critical care end time:  02/27/2019 9:45 AM   Critical care time was exclusive of:  Separately billable procedures and treating other patients   Critical care was necessary to treat or prevent imminent or life-threatening  deterioration of the following conditions:  Cardiac failure   Critical care was time spent personally by me on the following activities:  Blood draw for specimens, development of treatment plan with patient or surrogate, discussions with consultants, evaluation of patient's response to treatment, examination of patient, obtaining history from patient or surrogate, ordering and performing treatments and interventions, ordering and review of laboratory studies, ordering and review of radiographic studies, pulse oximetry, re-evaluation of patient's condition and review of old charts   (including critical care time)  Medications Ordered in ED Medications  aspirin EC tablet 81 mg (81 mg Oral Given 03/01/19 0824)  cholecalciferol (VITAMIN D3) tablet 1,000 Units (1,000 Units Oral Given 03/01/19 0824)  multivitamin with minerals tablet 1 tablet (1 tablet Oral Given 03/01/19 0824)  fluticasone (FLONASE) 50 MCG/ACT nasal spray 2 spray (2 sprays Each Nare Given 03/01/19 0826)  acetaminophen (TYLENOL) tablet 650 mg (650 mg Oral Given 02/28/19 1336)  ondansetron (ZOFRAN) injection 4 mg ( Intravenous MAR Unhold 02/28/19 1306)  sodium chloride flush (NS) 0.9 % injection 3 mL (3 mLs Intravenous Given 03/01/19 0827)  sodium chloride flush (NS) 0.9 % injection 3 mL ( Intravenous MAR Unhold 02/28/19 1306)  0.9 %  sodium chloride infusion ( Intravenous MAR Unhold 02/28/19 1306)  alum & mag hydroxide-simeth (MAALOX/MYLANTA) 200-200-20 MG/5ML suspension 15 mL ( Oral MAR Unhold 02/28/19 1306)  carvedilol (COREG) tablet 3.125 mg (3.125 mg Oral Given 03/01/19 0824)  losartan (COZAAR) tablet 25 mg (25 mg Oral Given 03/01/19 0826)  clopidogrel (PLAVIX) tablet 75 mg (75 mg Oral Given 03/01/19 1128)  rosuvastatin (CRESTOR) tablet 20 mg (has no administration in time range)  sodium chloride 0.9 % bolus 1,000 mL (0 mLs Intravenous Stopped 02/27/19 1130)  ondansetron (ZOFRAN) injection 4 mg (4 mg Intravenous Given 02/27/19 0948)  potassium  chloride 10 mEq in 100 mL IVPB (0 mEq Intravenous Stopped 02/27/19 1239)  aspirin chewable tablet 324 mg (324 mg Oral Given 02/27/19 1256)  heparin bolus via infusion 4,000 Units (4,000 Units Intravenous Bolus from Bag 02/27/19 1258)  sodium chloride 0.9 % bolus 500 mL (0 mLs Intravenous Stopped 02/27/19 1900)  magnesium sulfate IVPB 2 g 50 mL (0 g Intravenous Stopped 02/27/19 1900)  potassium chloride SA (K-DUR,KLOR-CON) CR tablet 40 mEq (40 mEq Oral Given 02/27/19 1749)  aspirin chewable tablet 81 mg (81 mg Oral Given 02/28/19 0516)  furosemide (LASIX) injection 20 mg (20 mg Intravenous Given 02/28/19 1714)     Initial Impression / Assessment and Plan / ED Course  I have reviewed the triage vital signs and the nursing notes.  Pertinent labs & imaging results that were available during my care of the patient were reviewed by me and considered in my medical decision making (see chart for details).       Patient with a past medical history of hypertension and hyperlipidemia presents to the ED after syncopal episode this morning.  She reports feeling dizzy and then fainting with sitting down in chair when found.  States he had a similar episode in September but this was due to dehydration.  He reports a negative cardiac work-up last September in [place].  Able to obtain these records from patient's  chart.  Patient reports he has not taken any of his medications today, does endorse some nausea.  On arrival via EMS patient was hypotensive given 200 mL's for resuscitation, she seemed to improve however patient continues to have soft pressures bolus ordered. I-STAT troponin was 0.21 patient, cardiology consult was placed further recommendations.  CMP remarkable for Hypokalemia, will provide patient with IV replacement, he currently has 1 IV in place.  Calcium noted to be decreased 5.8, will order ionized calcium to check for accuracy.  Rest of CMP is unremarkable.  CBC showed no leukocytosis, slight anemia with a  hemoglobin of 9.6, patient does not report a prior history.  CBG was within normal limits.  UA was negative for any nitrites, leukocytes. Chest x-ray showed: Low volume chest with presumed mild atelectasis.  Cardiology consult in place, please see chart for over their recommendations.   Final Clinical Impressions(s) / ED Diagnoses   Final diagnoses:  Elevated troponin  Near syncope  Hypokalemia    ED Discharge Orders    None       Claude Manges, Cordelia Poche 03/01/19 1507    Gerhard Munch, MD 03/02/19 2122

## 2019-02-27 NOTE — ED Triage Notes (Signed)
[place] [facility] [place] here from [place] with kids basketball tournament.  Felt faint feeling and dizzy, sat on a bench, passed out- states that he had chest pain prior to syncopal episode. Had a similar episode on football field in September, states was dehydrated.  On arrival pt denies any chest pain, c/o nausea-- has not eaten breakfast today.

## 2019-02-27 NOTE — Progress Notes (Signed)
ANTICOAGULATION CONSULT NOTE - Initial Consult  Pharmacy Consult for Heparin Indication: chest pain/ACS  Not on File  Patient Measurements: Height: 5\' 5"  (165.1 cm) Weight: 230 lb (104.3 kg) IBW/kg (Calculated) : 61.5 Heparin Dosing Weight:  85.2 kg  Vital Signs: Temp: 97.8 F (36.6 C) (03/08 0920) Temp Source: Oral (03/08 0920) BP: 107/58 (03/08 1230) Pulse Rate: 79 (03/08 1140)  Labs: Recent Labs    02/27/19 0952 02/27/19 1112  HGB 9.6*  --   HCT 30.7*  --   PLT 170  --   CREATININE 0.80 0.88  TROPONINI  --  0.49*    Estimated Creatinine Clearance: 105.4 mL/min (by C-G formula based on SCr of 0.88 mg/dL).   Medical History: Past Medical History:  Diagnosis Date  . High cholesterol   . Hypertension   . Obesity   . Obstructive sleep apnea on CPAP   . Syncope    Assessment: CC/HPI: CP and syncope, + troponins  PMH:  syncope, HTN, HLD, obesity, OSA   Significant events:  Dehydration with Similar episode 9/19. Negative cardiac w/u at that time.  Anticoag: IV heparin for CP. Hgb only 9.6. Plts 170.  Goal of Therapy:  Heparin level 0.3-0.7 units/ml Monitor platelets by anticoagulation protocol: Yes   Plan:  Heparin 4000 unit IV bolus Heparin infusion at 1200 units/hr Check heparin level in 6-8 hrs Daily HL and CBC   Kazi Montoro S. Merilynn Finland, PharmD, BCPS Clinical Staff Pharmacist Misty Stanley Stillinger 02/27/2019,12:47 PM

## 2019-02-28 ENCOUNTER — Encounter (HOSPITAL_COMMUNITY)
Admission: EM | Disposition: A | Discharge status: Home / Self Care | Payer: Self-pay | Source: Home / Self Care | Attending: Cardiology

## 2019-02-28 ENCOUNTER — Encounter (HOSPITAL_COMMUNITY): Payer: Self-pay | Admitting: Interventional Cardiology

## 2019-02-28 DIAGNOSIS — E876 Hypokalemia: Secondary | ICD-10-CM

## 2019-02-28 DIAGNOSIS — I25110 Atherosclerotic heart disease of native coronary artery with unstable angina pectoris: Secondary | ICD-10-CM

## 2019-02-28 DIAGNOSIS — R55 Syncope and collapse: Secondary | ICD-10-CM

## 2019-02-28 DIAGNOSIS — E785 Hyperlipidemia, unspecified: Secondary | ICD-10-CM

## 2019-02-28 DIAGNOSIS — I200 Unstable angina: Secondary | ICD-10-CM

## 2019-02-28 DIAGNOSIS — I429 Cardiomyopathy, unspecified: Secondary | ICD-10-CM

## 2019-02-28 DIAGNOSIS — D649 Anemia, unspecified: Secondary | ICD-10-CM

## 2019-02-28 HISTORY — PX: LEFT HEART CATH AND CORONARY ANGIOGRAPHY: CATH118249

## 2019-02-28 LAB — BASIC METABOLIC PANEL
Anion gap: 8 (ref 5–15)
BUN: 13 mg/dL (ref 6–20)
CO2: 24 mmol/L (ref 22–32)
Calcium: 8.6 mg/dL — ABNORMAL LOW (ref 8.9–10.3)
Chloride: 111 mmol/L (ref 98–111)
Creatinine, Ser: 0.89 mg/dL (ref 0.61–1.24)
GFR calc Af Amer: >60 mL/min (ref >60)
GFR calc non Af Amer: >60 mL/min (ref >60)
Glucose, Bld: 99 mg/dL (ref 70–99)
Potassium: 3.7 mmol/L (ref 3.5–5.1)
Sodium: 143 mmol/L (ref 135–145)

## 2019-02-28 LAB — CBC
HCT: 36 % — ABNORMAL LOW (ref 39.0–52.0)
Hemoglobin: 11.5 g/dL — ABNORMAL LOW (ref 13.0–17.0)
MCH: 27.7 pg (ref 26.0–34.0)
MCHC: 31.9 g/dL (ref 30.0–36.0)
MCV: 86.7 fL (ref 80.0–100.0)
Platelets: 225 10*3/uL (ref 150–400)
RBC: 4.15 MIL/uL — ABNORMAL LOW (ref 4.22–5.81)
RDW: 13.2 % (ref 11.5–15.5)
WBC: 8.2 10*3/uL (ref 4.0–10.5)
nRBC: 0 % (ref 0.0–0.2)

## 2019-02-28 LAB — LIPID PANEL
Cholesterol: 137 mg/dL (ref 0–200)
HDL: 35 mg/dL — ABNORMAL LOW (ref >40)
LDL CALC: 91 mg/dL (ref 0–99)
Total CHOL/HDL Ratio: 3.9 RATIO
Triglycerides: 55 mg/dL (ref <150)
VLDL: 11 mg/dL (ref 0–40)

## 2019-02-28 LAB — TROPONIN I
Troponin I: 1.26 ng/mL (ref <0.03)
Troponin I: 1.01 ng/mL (ref <0.03)

## 2019-02-28 LAB — MAGNESIUM: Magnesium: 1.8 mg/dL (ref 1.7–2.4)

## 2019-02-28 LAB — HEPARIN LEVEL (UNFRACTIONATED)
Heparin Unfractionated: 0.18 IU/mL — ABNORMAL LOW (ref 0.30–0.70)
Heparin Unfractionated: 0.39 IU/mL (ref 0.30–0.70)

## 2019-02-28 LAB — HIV ANTIBODY (ROUTINE TESTING W REFLEX): HIV Screen 4th Generation wRfx: NONREACTIVE

## 2019-02-28 LAB — MRSA PCR SCREENING: MRSA by PCR: NEGATIVE

## 2019-02-28 LAB — CALCIUM, IONIZED: Calcium, Ionized, Serum: 4.8 mg/dL (ref 4.5–5.6)

## 2019-02-28 SURGERY — LEFT HEART CATH AND CORONARY ANGIOGRAPHY
Anesthesia: LOCAL

## 2019-02-28 MED ORDER — VERAPAMIL HCL 2.5 MG/ML IV SOLN
INTRAVENOUS | Status: DC | PRN
  Administered 2019-02-28: 10 mL via INTRA_ARTERIAL

## 2019-02-28 MED ORDER — HEPARIN SODIUM (PORCINE) 1000 UNIT/ML IJ SOLN
INTRAMUSCULAR | Status: DC | PRN
  Administered 2019-02-28: 5500 [IU] via INTRAVENOUS

## 2019-02-28 MED ORDER — LOSARTAN POTASSIUM 25 MG PO TABS
25.0000 mg | ORAL_TABLET | Freq: Every day | ORAL | Status: DC
  Administered 2019-02-28 – 2019-03-02 (×3): 25 mg via ORAL
  Filled 2019-02-28 (×3): qty 1

## 2019-02-28 MED ORDER — FUROSEMIDE 10 MG/ML IJ SOLN
INTRAMUSCULAR | Status: AC
  Filled 2019-02-28: qty 4

## 2019-02-28 MED ORDER — IOHEXOL 350 MG/ML SOLN
INTRAVENOUS | Status: DC | PRN
  Administered 2019-02-28: 45 mL via INTRACARDIAC

## 2019-02-28 MED ORDER — HEPARIN SODIUM (PORCINE) 1000 UNIT/ML IJ SOLN
INTRAMUSCULAR | Status: AC
  Filled 2019-02-28: qty 1

## 2019-02-28 MED ORDER — LIDOCAINE HCL (PF) 1 % IJ SOLN
INTRAMUSCULAR | Status: DC | PRN
  Administered 2019-02-28: 2 mL

## 2019-02-28 MED ORDER — HEPARIN (PORCINE) IN NACL 1000-0.9 UT/500ML-% IV SOLN
INTRAVENOUS | Status: DC | PRN
  Administered 2019-02-28 (×2): 500 mL

## 2019-02-28 MED ORDER — CARVEDILOL 3.125 MG PO TABS
3.1250 mg | ORAL_TABLET | Freq: Two times a day (BID) | ORAL | Status: DC
  Administered 2019-02-28 – 2019-03-02 (×4): 3.125 mg via ORAL
  Filled 2019-02-28 (×4): qty 1

## 2019-02-28 MED ORDER — HEPARIN (PORCINE) IN NACL 1000-0.9 UT/500ML-% IV SOLN
INTRAVENOUS | Status: AC
  Filled 2019-02-28: qty 1000

## 2019-02-28 MED ORDER — FENTANYL CITRATE (PF) 100 MCG/2ML IJ SOLN
INTRAMUSCULAR | Status: DC | PRN
  Administered 2019-02-28: 25 ug via INTRAVENOUS

## 2019-02-28 MED ORDER — LIDOCAINE HCL (PF) 1 % IJ SOLN
INTRAMUSCULAR | Status: AC
  Filled 2019-02-28: qty 30

## 2019-02-28 MED ORDER — FUROSEMIDE 10 MG/ML IJ SOLN
20.0000 mg | Freq: Once | INTRAMUSCULAR | Status: AC
  Administered 2019-02-28: 20 mg via INTRAVENOUS
  Filled 2019-02-28: qty 2

## 2019-02-28 MED ORDER — FUROSEMIDE 10 MG/ML IJ SOLN
INTRAMUSCULAR | Status: DC | PRN
  Administered 2019-02-28: 20 mg via INTRAVENOUS

## 2019-02-28 MED ORDER — VERAPAMIL HCL 2.5 MG/ML IV SOLN
INTRAVENOUS | Status: AC
  Filled 2019-02-28: qty 2

## 2019-02-28 MED ORDER — FENTANYL CITRATE (PF) 100 MCG/2ML IJ SOLN
INTRAMUSCULAR | Status: AC
  Filled 2019-02-28: qty 2

## 2019-02-28 MED ORDER — ALUM & MAG HYDROXIDE-SIMETH 200-200-20 MG/5ML PO SUSP
15.0000 mL | ORAL | Status: DC | PRN
  Administered 2019-02-28: 15 mL via ORAL
  Filled 2019-02-28: qty 30

## 2019-02-28 SURGICAL SUPPLY — 11 items
CATH 5FR JL3.5 JR4 ANG PIG MP (CATHETERS) ×2 IMPLANT
DEVICE RAD COMP TR BAND LRG (VASCULAR PRODUCTS) ×2 IMPLANT
ELECT DEFIB PAD ADLT CADENCE (PAD) ×2 IMPLANT
GLIDESHEATH SLEND A-KIT 6F 22G (SHEATH) ×2 IMPLANT
GUIDEWIRE INQWIRE 1.5J.035X260 (WIRE) ×1 IMPLANT
INQWIRE 1.5J .035X260CM (WIRE) ×2
KIT HEART LEFT (KITS) ×2 IMPLANT
PACK CARDIAC CATHETERIZATION (CUSTOM PROCEDURE TRAY) ×2 IMPLANT
SHEATH PROBE COVER 6X72 (BAG) ×2 IMPLANT
TRANSDUCER W/STOPCOCK (MISCELLANEOUS) ×2 IMPLANT
TUBING CIL FLEX 10 FLL-RA (TUBING) ×2 IMPLANT

## 2019-02-28 NOTE — Interval H&P Note (Signed)
Cath Lab Visit (complete for each Cath Lab visit)  Clinical Evaluation Leading to the Procedure:   ACS: No.  Non-ACS:    Anginal Classification: CCS III  Anti-ischemic medical therapy: Minimal Therapy (1 class of medications)  Non-Invasive Test Results: No non-invasive testing performed  Prior CABG: No previous CABG      History and Physical Interval Note:  02/28/2019 12:18 PM  Derrick Stark  has presented today for surgery, with the diagnosis of NSTEMI.  The various methods of treatment have been discussed with the patient and family. After consideration of risks, benefits and other options for treatment, the patient has consented to  Procedure(s): LEFT HEART CATH AND CORONARY ANGIOGRAPHY (N/A) as a surgical intervention.  The patient's history has been reviewed, patient examined, no change in status, stable for surgery.  I have reviewed the patient's chart and labs.  Questions were answered to the patient's satisfaction.     Lyn Records III

## 2019-02-28 NOTE — Progress Notes (Signed)
Progress Note  Patient Name: Derrick Stark Date of Encounter: 02/28/2019  Primary Cardiologist: New ([facility])  Subjective   Still notes occasional mild substernal chest tightness. No recurrent syncope.   Inpatient Medications    Scheduled Meds: . aspirin EC  81 mg Oral Daily  . cholecalciferol  1,000 Units Oral Daily  . fluticasone  2 spray Each Nare Daily  . multivitamin with minerals  1 tablet Oral Daily  . simvastatin  20 mg Oral Daily  . sodium chloride flush  3 mL Intravenous Q12H  . sodium chloride flush  3 mL Intravenous Q12H   Continuous Infusions: . sodium chloride    . sodium chloride    . sodium chloride 75 mL/hr at 02/28/19 0518  . heparin 1,450 Units/hr (02/28/19 0730)   PRN Meds: sodium chloride, sodium chloride, acetaminophen, ondansetron (ZOFRAN) IV, sodium chloride flush, sodium chloride flush   Vital Signs    Vitals:   02/27/19 1732 02/27/19 2031 02/27/19 2356 02/28/19 0513  BP: 116/65 112/62 112/68 118/61  Pulse:  79  81  Resp:  20 20   Temp: 97.8 F (36.6 C) 98 F (36.7 C)  97.9 F (36.6 C)  TempSrc: Oral Oral  Oral  SpO2: 100% 97% 98% 99%  Weight: 111 kg   109.9 kg  Height: 5\' 5"  (1.651 m)       Intake/Output Summary (Last 24 hours) at 02/28/2019 0952 Last data filed at 02/28/2019 0400 Gross per 24 hour  Intake 195.96 ml  Output -  Net 195.96 ml   Last 3 Weights 02/28/2019 02/27/2019 02/27/2019  Weight (lbs) 242 lb 4.8 oz 244 lb 11.2 oz 230 lb  Weight (kg) 109.907 kg 110.995 kg 104.327 kg      Telemetry    NSR. No arrhthymias  - Personally Reviewed  ECG    NSR prolonged QT - Personally Reviewed  Physical Exam   GEN: moderately obese middle aged AAM in No acute distress.   Neck: No JVD Cardiac: RRR, no murmurs, rubs, or gallops.  Respiratory: Clear to auscultation bilaterally. GI: Soft, nontender, non-distended  MS: No edema; No deformity. Neuro:  Nonfocal  Psych: Normal affect   Labs    Chemistry Recent Labs  Lab  02/27/19 0952 02/27/19 1112 02/28/19 0611  NA 157* 142 143  K 2.6* 3.5 3.7  CL 94* 108 111  CO2 17* 26 24  GLUCOSE 116* 116* 99  BUN 15 15 13   CREATININE 0.80 0.88 0.89  CALCIUM 5.8* 8.9 8.6*  PROT 5.0*  --   --   ALBUMIN 2.9*  --   --   AST 34  --   --   ALT 41  --   --   ALKPHOS 50  --   --   BILITOT 0.6  --   --   GFRNONAA >60 >60 >60  GFRAA >60 >60 >60  ANIONGAP 46* 8 8     Hematology Recent Labs  Lab 02/27/19 0952 02/27/19 1836 02/28/19 0611  WBC 6.9 8.6 8.2  RBC 3.48* 4.15*  4.15* 4.15*  HGB 9.6* 11.5* 11.5*  HCT 30.7* 35.5* 36.0*  MCV 88.2 85.5 86.7  MCH 27.6 27.7 27.7  MCHC 31.3 32.4 31.9  RDW 13.1 13.1 13.2  PLT 170 231 225    Cardiac Enzymes Recent Labs  Lab 02/27/19 1112 02/27/19 1836 02/28/19 0022 02/28/19 0611  TROPONINI 0.49* 1.74* 1.26* 1.01*    Recent Labs  Lab 02/27/19 0953  TROPIPOC 0.21*  BNPNo results for input(s): BNP, PROBNP in the last 168 hours.   DDimer No results for input(s): DDIMER in the last 168 hours.   Radiology    Dg Chest 2 View  Result Date: 02/27/2019 CLINICAL DATA:  Syncope and chest pain EXAM: CHEST - 2 VIEW COMPARISON:  None. FINDINGS: Low volume chest with mild interstitial crowding/hazy density at the bases. There is no edema, consolidation, effusion, or pneumothorax. Normal heart size and mediastinal contours. No acute osseous finding IMPRESSION: Low volume chest with presumed mild atelectasis. Electronically Signed   By: Marnee Spring M.D.   On: 02/27/2019 10:43    Cardiac Studies   2D Echo 02/27/19 1. The left ventricle has mild-moderately reduced systolic function, with an ejection fraction of 40-45%. The cavity size was normal. Left ventricular diastolic Doppler parameters are consistent with pseudonormalization Elevated left ventricular  end-diastolic pressure Left ventricular diffuse hypokinesis.  2. The right ventricle has normal systolic function. The cavity was normal. There is no increase in  right ventricular wall thickness.  3. Right atrial size was mildly dilated.  4. The mitral valve is normal in structure.  5. The tricuspid valve is normal in structure.  6. The aortic valve is tricuspid.  7. The pulmonic valve was normal in structure.  8. The inferior vena cava was dilated in size with >50% respiratory variability.  SUMMARY   Mild diffuse hypokinesis with LVEF 45-50%. Grade 2 diastolic dysfunction with elevated filling pressures.  LHC- pending   Patient Profile     Derrick Stark is a 58 y.o. male with a hx of syncope, HTN, HLD, obesity and OSA who is being seen by cardiology for the evaluation of syncope, chest tightness and elevated troponin at the request of Dr. Jeraldine Loots. Also found to have reduced EF on echo, 40-45%. He lives in [place] and came to [place] for a [employer]	[place] tournament and passed out at his hotel 02/27/19.   Per pt report, he had a similar event in Sep 2019 w/ syncope and CP. Evaluated in [place]. Reports he had a cardiac cath that was normal and was told syncope was related to severe dehydration and hypokalemia.   Assessment & Plan    1. NSTEMI: presented w/ syncope and CP. Troponin peaked at 1.74. Echo with reduced EF at 40-45% w/ diffuse hypokinesis. No baseline for comparison. Similar situation as last fall (description outlined above).  Reports he had a cardiac cath that was normal and was told syncope was related to severe dehydration and hypokalemia. We do not have access to these records. Rounding MD saw yesterday and recommended repeat cath. He is scheduled for cath today but will discuss with Dr. [place] first.    2. Syncope: Per H&P, he has a prior h/o syncope in 08/2018 with negative cardiac w/u, per pt. No records available. Echo done yesterday with mild-moderately reduced LVEF and no significant valvular abnormalties. No pericardial effusion. No carotid bruits on exam. Troponin elevated, peaking at 1.74. On for cath today  but will discuss with Dr. [place] before proceeding, given reports of normal cath in September. No arrhthymias on tele. Suspect dehydration was a likely cause. Pt reports diarrhea x 2 yesterday morning, poor PO intake and he also took his home BP meds yesterday morning. Will check orthostatics. BP still soft. Continue to hold antihypertensives.   3. HTN: controlled this morning but home meds currently on hold as BP was soft early on during admission, On Losartan and HCTZ at home. Pressure  this am 118/61. Continue to hold home meds. Will check orthostatics.   4. HLD: on simvastatin as outpatient. Lipid panel today showed LDL at 91. If evidence of CAD on cath, will adjust statin regimen for further LDL reduction to target level <70. If no CAD, continue current dose of simvastatin.   5. Anemia: Hgb 11.5 (was reported as 9.6 on admit, but suspect possible lab error). MCV 88. FOBT ordered but not yet collected. Vit B 12 low. Folate normal. Ferritin WNL. He denies melena and hematochezia. Reports he had a colonoscopy 2 years ok that was "ok'.  6. Hypokalemia: 2.6 on admit. In the setting of diarrhea yesterday morning. WNL after supplementation. Continue to monitor.   For questions or updates, please contact [facility] Please consult [email or web address] for contact info under        Signed, Brittainy Simmons, PA-C  02/28/2019, 9:52 AM    

## 2019-02-28 NOTE — Progress Notes (Signed)
ANTICOAGULATION CONSULT NOTE  Pharmacy Consult for Heparin Indication: chest pain/ACS  No Known Allergies  Patient Measurements: Height: 5\' 5"  (165.1 cm) Weight: 242 lb 4.8 oz (109.9 kg) IBW/kg (Calculated) : 61.5 Heparin Dosing Weight:  85.2 kg  Vital Signs: Temp: 97.9 F (36.6 C) (03/09 0513) Temp Source: Oral (03/09 0513) BP: 118/61 (03/09 0513) Pulse Rate: 81 (03/09 0513)  Labs: Recent Labs    02/27/19 0952  02/27/19 1112 02/27/19 1836 02/27/19 2056 02/28/19 0022 02/28/19 0611  HGB 9.6*  --   --  11.5*  --   --  11.5*  HCT 30.7*  --   --  35.5*  --   --  36.0*  PLT 170  --   --  231  --   --  225  HEPARINUNFRC  --   --   --   --  0.11* 0.18* 0.39  CREATININE 0.80  --  0.88  --   --   --  0.89  TROPONINI  --    < > 0.49* 1.74*  --  1.26* 1.01*   < > = values in this interval not displayed.    Estimated Creatinine Clearance: 107.3 mL/min (by C-G formula based on SCr of 0.89 mg/dL).   Medical History: Past Medical History:  Diagnosis Date  . High cholesterol   . Hypertension   . Obesity   . Obstructive sleep apnea on CPAP   . Syncope    Assessment: 57 yom presenting with CP and syncope, + troponins. Of note, has hx of syncope, HTN, HLD, obesity, OSA . Plans noted for cath today. -heparin level at goal, hg stable  Goal of Therapy:  Heparin level 0.3-0.7 units/ml Monitor platelets by anticoagulation protocol: Yes   Plan:  No heparin changes needed Daily HL and CBC  Harland German, PharmD Clinical Pharmacist **Pharmacist phone directory can now be found on [email or web address] ([email or web address]).  Listed under [facility].

## 2019-02-28 NOTE — H&P (View-Only) (Signed)
Progress Note  Patient Name: Derrick Stark Date of Encounter: 02/28/2019  Primary Cardiologist: New (From [facility])  Subjective   Still notes occasional mild substernal chest tightness. No recurrent syncope.   Inpatient Medications    Scheduled Meds: . aspirin EC  81 mg Oral Daily  . cholecalciferol  1,000 Units Oral Daily  . fluticasone  2 spray Each Nare Daily  . multivitamin with minerals  1 tablet Oral Daily  . simvastatin  20 mg Oral Daily  . sodium chloride flush  3 mL Intravenous Q12H  . sodium chloride flush  3 mL Intravenous Q12H   Continuous Infusions: . sodium chloride    . sodium chloride    . sodium chloride 75 mL/hr at 02/28/19 0518  . heparin 1,450 Units/hr (02/28/19 0730)   PRN Meds: sodium chloride, sodium chloride, acetaminophen, ondansetron (ZOFRAN) IV, sodium chloride flush, sodium chloride flush   Vital Signs    Vitals:   02/27/19 1732 02/27/19 2031 02/27/19 2356 02/28/19 0513  BP: 116/65 112/62 112/68 118/61  Pulse:  79  81  Resp:  20 20   Temp: 97.8 F (36.6 C) 98 F (36.7 C)  97.9 F (36.6 C)  TempSrc: Oral Oral  Oral  SpO2: 100% 97% 98% 99%  Weight: 111 kg   109.9 kg  Height: 5\' 5"  (1.651 m)       Intake/Output Summary (Last 24 hours) at 02/28/2019 0952 Last data filed at 02/28/2019 0400 Gross per 24 hour  Intake 195.96 ml  Output -  Net 195.96 ml   Last 3 Weights 02/28/2019 02/27/2019 02/27/2019  Weight (lbs) 242 lb 4.8 oz 244 lb 11.2 oz 230 lb  Weight (kg) 109.907 kg 110.995 kg 104.327 kg      Telemetry    NSR. No arrhthymias  - Personally Reviewed  ECG    NSR prolonged QT - Personally Reviewed  Physical Exam   GEN: moderately obese middle aged AAM in No acute distress.   Neck: No JVD Cardiac: RRR, no murmurs, rubs, or gallops.  Respiratory: Clear to auscultation bilaterally. GI: Soft, nontender, non-distended  MS: No edema; No deformity. Neuro:  Nonfocal  Psych: Normal affect   Labs    Chemistry Recent Labs  Lab  02/27/19 0952 02/27/19 1112 02/28/19 0611  NA 157* 142 143  K 2.6* 3.5 3.7  CL 94* 108 111  CO2 17* 26 24  GLUCOSE 116* 116* 99  BUN 15 15 13   CREATININE 0.80 0.88 0.89  CALCIUM 5.8* 8.9 8.6*  PROT 5.0*  --   --   ALBUMIN 2.9*  --   --   AST 34  --   --   ALT 41  --   --   ALKPHOS 50  --   --   BILITOT 0.6  --   --   GFRNONAA >60 >60 >60  GFRAA >60 >60 >60  ANIONGAP 46* 8 8     Hematology Recent Labs  Lab 02/27/19 0952 02/27/19 1836 02/28/19 0611  WBC 6.9 8.6 8.2  RBC 3.48* 4.15*  4.15* 4.15*  HGB 9.6* 11.5* 11.5*  HCT 30.7* 35.5* 36.0*  MCV 88.2 85.5 86.7  MCH 27.6 27.7 27.7  MCHC 31.3 32.4 31.9  RDW 13.1 13.1 13.2  PLT 170 231 225    Cardiac Enzymes Recent Labs  Lab 02/27/19 1112 02/27/19 1836 02/28/19 0022 02/28/19 0611  TROPONINI 0.49* 1.74* 1.26* 1.01*    Recent Labs  Lab 02/27/19 0953  TROPIPOC 0.21*  BNPNo results for input(s): BNP, PROBNP in the last 168 hours.   DDimer No results for input(s): DDIMER in the last 168 hours.   Radiology    Dg Chest 2 View  Result Date: 02/27/2019 CLINICAL DATA:  Syncope and chest pain EXAM: CHEST - 2 VIEW COMPARISON:  None. FINDINGS: Low volume chest with mild interstitial crowding/hazy density at the bases. There is no edema, consolidation, effusion, or pneumothorax. Normal heart size and mediastinal contours. No acute osseous finding IMPRESSION: Low volume chest with presumed mild atelectasis. Electronically Signed   By: Marnee Spring M.D.   On: 02/27/2019 10:43    Cardiac Studies   2D Echo 02/27/19 1. The left ventricle has mild-moderately reduced systolic function, with an ejection fraction of 40-45%. The cavity size was normal. Left ventricular diastolic Doppler parameters are consistent with pseudonormalization Elevated left ventricular  end-diastolic pressure Left ventricular diffuse hypokinesis.  2. The right ventricle has normal systolic function. The cavity was normal. There is no increase in  right ventricular wall thickness.  3. Right atrial size was mildly dilated.  4. The mitral valve is normal in structure.  5. The tricuspid valve is normal in structure.  6. The aortic valve is tricuspid.  7. The pulmonic valve was normal in structure.  8. The inferior vena cava was dilated in size with >50% respiratory variability.  SUMMARY   Mild diffuse hypokinesis with LVEF 45-50%. Grade 2 diastolic dysfunction with elevated filling pressures.  LHC- pending   Patient Profile     Derrick Stark is a 58 y.o. male with a hx of syncope, HTN, HLD, obesity and OSA who is being seen by cardiology for the evaluation of syncope, chest tightness and elevated troponin at the request of Dr. Jeraldine Loots. Also found to have reduced EF on echo, 40-45%. He lives in [place] and came to [place] for a [employer]	[place] tournament and passed out at his hotel 02/27/19.   Per pt report, he had a similar event in Sep 2019 w/ syncope and CP. Evaluated in [place]. Reports he had a cardiac cath that was normal and was told syncope was related to severe dehydration and hypokalemia.   Assessment & Plan    1. NSTEMI: presented w/ syncope and CP. Troponin peaked at 1.74. Echo with reduced EF at 40-45% w/ diffuse hypokinesis. No baseline for comparison. Similar situation as last fall (description outlined above).  Reports he had a cardiac cath that was normal and was told syncope was related to severe dehydration and hypokalemia. We do not have access to these records. Rounding MD saw yesterday and recommended repeat cath. He is scheduled for cath today but will discuss with Dr. Swaziland first.    2. Syncope: Per H&P, he has a prior h/o syncope in 08/2018 with negative cardiac w/u, per pt. No records available. Echo done yesterday with mild-moderately reduced LVEF and no significant valvular abnormalties. No pericardial effusion. No carotid bruits on exam. Troponin elevated, peaking at 1.74. On for cath today  but will discuss with Dr. Swaziland before proceeding, given reports of normal cath in September. No arrhthymias on tele. Suspect dehydration was a likely cause. Pt reports diarrhea x 2 yesterday morning, poor PO intake and he also took his home BP meds yesterday morning. Will check orthostatics. BP still soft. Continue to hold antihypertensives.   3. HTN: controlled this morning but home meds currently on hold as BP was soft early on during admission, On Losartan and HCTZ at home. Pressure  this am 118/61. Continue to hold home meds. Will check orthostatics.   4. HLD: on simvastatin as outpatient. Lipid panel today showed LDL at 91. If evidence of CAD on cath, will adjust statin regimen for further LDL reduction to target level <70. If no CAD, continue current dose of simvastatin.   5. Anemia: Hgb 11.5 (was reported as 9.6 on admit, but suspect possible lab error). MCV 88. FOBT ordered but not yet collected. Vit B 12 low. Folate normal. Ferritin WNL. He denies melena and hematochezia. Reports he had a colonoscopy 2 years ok that was "ok'.  6. Hypokalemia: 2.6 on admit. In the setting of diarrhea yesterday morning. WNL after supplementation. Continue to monitor.   For questions or updates, please contact [facility] Please consult [email or web address] for contact info under        Signed, Robbie LisBrittainy Aleksandr Pellow, PA-C  02/28/2019, 9:52 AM

## 2019-03-01 LAB — BASIC METABOLIC PANEL
Anion gap: 9 (ref 5–15)
BUN: 14 mg/dL (ref 6–20)
CHLORIDE: 107 mmol/L (ref 98–111)
CO2: 24 mmol/L (ref 22–32)
Calcium: 8.8 mg/dL — ABNORMAL LOW (ref 8.9–10.3)
Creatinine, Ser: 1.18 mg/dL (ref 0.61–1.24)
GFR calc Af Amer: >60 mL/min (ref >60)
GFR calc non Af Amer: >60 mL/min (ref >60)
Glucose, Bld: 93 mg/dL (ref 70–99)
Potassium: 3.2 mmol/L — ABNORMAL LOW (ref 3.5–5.1)
Sodium: 140 mmol/L (ref 135–145)

## 2019-03-01 LAB — CBC
HCT: 34.1 % — ABNORMAL LOW (ref 39.0–52.0)
Hemoglobin: 11.3 g/dL — ABNORMAL LOW (ref 13.0–17.0)
MCH: 28.5 pg (ref 26.0–34.0)
MCHC: 33.1 g/dL (ref 30.0–36.0)
MCV: 86.1 fL (ref 80.0–100.0)
Platelets: 236 10*3/uL (ref 150–400)
RBC: 3.96 MIL/uL — ABNORMAL LOW (ref 4.22–5.81)
RDW: 13.2 % (ref 11.5–15.5)
WBC: 10.1 10*3/uL (ref 4.0–10.5)
nRBC: 0 % (ref 0.0–0.2)

## 2019-03-01 LAB — BRAIN NATRIURETIC PEPTIDE: B Natriuretic Peptide: 196.3 pg/mL — ABNORMAL HIGH (ref 0.0–100.0)

## 2019-03-01 LAB — MAGNESIUM: Magnesium: 1.7 mg/dL (ref 1.7–2.4)

## 2019-03-01 MED ORDER — ROSUVASTATIN CALCIUM 20 MG PO TABS
20.0000 mg | ORAL_TABLET | Freq: Every day | ORAL | Status: DC
  Administered 2019-03-01: 20 mg via ORAL
  Filled 2019-03-01: qty 1

## 2019-03-01 MED ORDER — CLOPIDOGREL BISULFATE 75 MG PO TABS
75.0000 mg | ORAL_TABLET | Freq: Every day | ORAL | Status: DC
  Administered 2019-03-01 – 2019-03-02 (×2): 75 mg via ORAL
  Filled 2019-03-01 (×2): qty 1

## 2019-03-01 NOTE — Progress Notes (Signed)
Progress Note  Patient Name: Derrick Stark Date of Encounter: 03/01/2019  Primary Cardiologist: New (From [facility])  Subjective   Feels well. No chest pain or dyspnea. No recurrent syncope.   Inpatient Medications    Scheduled Meds: . aspirin EC  81 mg Oral Daily  . carvedilol  3.125 mg Oral BID WC  . cholecalciferol  1,000 Units Oral Daily  . fluticasone  2 spray Each Nare Daily  . losartan  25 mg Oral Daily  . multivitamin with minerals  1 tablet Oral Daily  . simvastatin  20 mg Oral Daily  . sodium chloride flush  3 mL Intravenous Q12H   Continuous Infusions: . sodium chloride     PRN Meds: sodium chloride, acetaminophen, alum & mag hydroxide-simeth, ondansetron (ZOFRAN) IV, sodium chloride flush   Vital Signs    Vitals:   02/28/19 1955 02/28/19 2302 03/01/19 0621 03/01/19 0823  BP: 99/64  90/60 (!) 144/127  Pulse: 96 87 75 88  Resp: (!) 25 19 20  (!) 22  Temp: 98 F (36.7 C)  98.1 F (36.7 C)   TempSrc: Oral  Oral   SpO2: 98% 98% 97% 94%  Weight:   108 kg   Height:        Intake/Output Summary (Last 24 hours) at 03/01/2019 0849 Last data filed at 02/28/2019 2045 Gross per 24 hour  Intake 712.71 ml  Output 800 ml  Net -87.29 ml   Last 3 Weights 03/01/2019 02/28/2019 02/27/2019  Weight (lbs) 238 lb 1.6 oz 242 lb 4.8 oz 244 lb 11.2 oz  Weight (kg) 108 kg 109.907 kg 110.995 kg      Telemetry    NSR. No arrhthymias  - Personally Reviewed  ECG    None today- Personally Reviewed  Physical Exam   GEN: moderately obese middle aged AAM in No acute distress.   Neck: No JVD Cardiac: RRR, no murmurs, rubs, or gallops.  Respiratory: Clear to auscultation bilaterally. GI: Soft, nontender, non-distended  MS: No edema; No deformity. No radial site hematoma Neuro:  Nonfocal  Psych: Normal affect   Labs    Chemistry Recent Labs  Lab 02/27/19 0952 02/27/19 1112 02/28/19 0611 03/01/19 0321  NA 157* 142 143 140  K 2.6* 3.5 3.7 3.2*  CL 94* 108 111 107    CO2 17* 26 24 24   GLUCOSE 116* 116* 99 93  BUN 15 15 13 14   CREATININE 0.80 0.88 0.89 1.18  CALCIUM 5.8* 8.9 8.6* 8.8*  PROT 5.0*  --   --   --   ALBUMIN 2.9*  --   --   --   AST 34  --   --   --   ALT 41  --   --   --   ALKPHOS 50  --   --   --   BILITOT 0.6  --   --   --   GFRNONAA >60 >60 >60 >60  GFRAA >60 >60 >60 >60  ANIONGAP 46* 8 8 9      Hematology Recent Labs  Lab 02/27/19 1836 02/28/19 0611 03/01/19 0321  WBC 8.6 8.2 10.1  RBC 4.15*  4.15* 4.15* 3.96*  HGB 11.5* 11.5* 11.3*  HCT 35.5* 36.0* 34.1*  MCV 85.5 86.7 86.1  MCH 27.7 27.7 28.5  MCHC 32.4 31.9 33.1  RDW 13.1 13.2 13.2  PLT 231 225 236    Cardiac Enzymes Recent Labs  Lab 02/27/19 1112 02/27/19 1836 02/28/19 0022 02/28/19 0611  TROPONINI 0.49* 1.74* 1.26*  1.01*    Recent Labs  Lab 02/27/19 0953  TROPIPOC 0.21*     BNP Recent Labs  Lab 03/01/19 0321  BNP 196.3*     DDimer No results for input(s): DDIMER in the last 168 hours.   Radiology    Dg Chest 2 View  Result Date: 02/27/2019 CLINICAL DATA:  Syncope and chest pain EXAM: CHEST - 2 VIEW COMPARISON:  None. FINDINGS: Low volume chest with mild interstitial crowding/hazy density at the bases. There is no edema, consolidation, effusion, or pneumothorax. Normal heart size and mediastinal contours. No acute osseous finding IMPRESSION: Low volume chest with presumed mild atelectasis. Electronically Signed   By: Marnee Spring M.D.   On: 02/27/2019 10:43    Cardiac Studies   2D Echo 02/27/19 1. The left ventricle has mild-moderately reduced systolic function, with an ejection fraction of 40-45%. The cavity size was normal. Left ventricular diastolic Doppler parameters are consistent with pseudonormalization Elevated left ventricular  end-diastolic pressure Left ventricular diffuse hypokinesis.  2. The right ventricle has normal systolic function. The cavity was normal. There is no increase in right ventricular wall thickness.  3. Right  atrial size was mildly dilated.  4. The mitral valve is normal in structure.  5. The tricuspid valve is normal in structure.  6. The aortic valve is tricuspid.  7. The pulmonic valve was normal in structure.  8. The inferior vena cava was dilated in size with >50% respiratory variability.  SUMMARY   Mild diffuse hypokinesis with LVEF 45-50%. Grade 2 diastolic dysfunction with elevated filling pressures.  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  Suggestion of left to right collaterals from the apical LAD.  Mid RCA contains eccentric 60% narrowing.  Cannot exclude ruptured plaque with distal embolization.  Normal left main  Normal LAD  Normal circumflex  Overall low normal LVEF 50%.  LVEDP 23 mmHg.  Consistent with diastolic heart failure.  Orthopnea and cough  RECOMMENDATIONS:   Antiplatelet therapy.  Consider Plavix and aspirin.  Risk factor modification with lipid lowering, blood pressure control, evaluation of glycemic control, sleep apnea evaluation, weight loss, moderate exercise.  Furosemide, 20 mg IV given in Cath Lab due to orthopnea and cough related to supine position.      Patient Profile     Derrick Stark is a 58 y.o. male with a hx of syncope, HTN, HLD, obesity and OSA who is being seen by cardiology for the evaluation of syncope, chest tightness and elevated troponin at the request of Dr. Jeraldine Loots. Also found to have reduced EF on echo, 40-45%. He lives in [place] and came to [place] for a [employer]	[place] tournament and passed out at his hotel 02/27/19.   Per pt report, he had a similar event in Sep 2019 w/ syncope and CP. Evaluated in [place]. Reports he had a cardiac cath that was normal and was told syncope was related to severe dehydration and hypokalemia.   Assessment & Plan    1. NSTEMI: presented w/ syncope and CP. Troponin peaked at 1.74. Echo with reduced EF at 40-45% w/ diffuse hypokinesis.  Apparently EF normal in September in [place].   Reports he had a cardiac cath that was normal and was told syncope was related to severe dehydration and hypokalemia. Now with findings on cath of distal PDA occlusion likely embolic. Suspect plaque erosion in the Mid RCA where he has some nonobstructive disease. Recommend medical management. Add Plavix for one  year. On beta blocker and statin.    2. Syncope:  Suspect dehydration as well as ACS. Hypokalemic. Now improved.   3. HTN: HCTZ stopped due to hypokalemia. On ARB and Coreg.   4. HLD: on simvastatin as outpatient. Lipid panel  showed LDL at 91. With ACS would recommend goal of LDL <70. Will switch zocor to Crestor. 20 mg daily.   5. Anemia: Hgb 11.5 (was reported as 9.6 on admit, but suspect possible lab error). MCV 88. FOBT ordered but not yet collected. Vit B 12 low. Folate normal. Ferritin WNL. He denies melena and hematochezia. Reports he had a colonoscopy 2 years ok that was "ok'.  6. Hypokalemia: 2.6 on admit. In the setting of diarrhea. 3.2 today. Did receive a dose of lasix yesterday. Will replete this am. Should improve with stopping HCTZ.   Patient is stable for DC from a cardiac standpoint. Recommend cardiology follow up in [place] in 2-3 weeks.   [facility] will sign off.   Medication Recommendations:  As per [facility] Other recommendations (labs, testing, etc):  none Follow up as an outpatient:  With cardiology in [place].   For questions or updates, please contact [facility] Please consult [email or web address] for contact info under        Signed, Willard Madrigal Swaz[place], MD  03/01/2019, 8:49 AM

## 2019-03-01 NOTE — Progress Notes (Signed)
Dr. Swaziland signed off patient this morning however on our service. Noted just few minutes ago when paged by nurse. He lives in [place] and no transportation. Will keep overnight and DC tomorrow. Pending summary.

## 2019-03-01 NOTE — Plan of Care (Signed)
  Problem: Clinical Measurements: Goal: Will remain free from infection Outcome: Progressing Note: No s/s of infection noted. Goal: Respiratory complications will improve Outcome: Progressing Note: No s/s of respiratory complications noted.  Stable on room air.   Problem: Coping: Goal: Level of anxiety will decrease Outcome: Progressing Note: No s/s of anxiety noted.   

## 2019-03-01 NOTE — Progress Notes (Signed)
Patient's wife took patient's home CPAP machine home with her. RT provided patient with hospital owned CPAP. RT set up CPAP and placed on patient. Patient is tolerating well at this time. RT will monitor as needed.

## 2019-03-01 NOTE — Progress Notes (Signed)
Patient has home CPAP and places himself on/off without assistance. RT will monitor as needed.  

## 2019-03-02 DIAGNOSIS — I214 Non-ST elevation (NSTEMI) myocardial infarction: Secondary | ICD-10-CM

## 2019-03-02 LAB — BASIC METABOLIC PANEL
Anion gap: 8 (ref 5–15)
BUN: 14 mg/dL (ref 6–20)
CO2: 24 mmol/L (ref 22–32)
Calcium: 8.5 mg/dL — ABNORMAL LOW (ref 8.9–10.3)
Chloride: 107 mmol/L (ref 98–111)
Creatinine, Ser: 0.99 mg/dL (ref 0.61–1.24)
GFR calc Af Amer: >60 mL/min (ref >60)
GFR calc non Af Amer: >60 mL/min (ref >60)
Glucose, Bld: 113 mg/dL — ABNORMAL HIGH (ref 70–99)
Potassium: 3.5 mmol/L (ref 3.5–5.1)
Sodium: 139 mmol/L (ref 135–145)

## 2019-03-02 LAB — MAGNESIUM: Magnesium: 1.6 mg/dL — ABNORMAL LOW (ref 1.7–2.4)

## 2019-03-02 MED ORDER — ROSUVASTATIN CALCIUM 20 MG PO TABS: 20.0000 mg | ORAL_TABLET | Freq: Every day | ORAL | 1 refills | Status: AC

## 2019-03-02 MED ORDER — CLOPIDOGREL BISULFATE 75 MG PO TABS: 75.0000 mg | ORAL_TABLET | Freq: Every day | ORAL | 3 refills | Status: DC

## 2019-03-02 MED ORDER — LOSARTAN POTASSIUM 25 MG PO TABS: 25.0000 mg | ORAL_TABLET | Freq: Every day | ORAL | 1 refills | Status: AC

## 2019-03-02 MED ORDER — CARVEDILOL 3.125 MG PO TABS: 3.1250 mg | ORAL_TABLET | Freq: Two times a day (BID) | ORAL | 1 refills | Status: AC

## 2019-03-02 NOTE — Plan of Care (Signed)

## 2019-03-02 NOTE — Discharge Instructions (Signed)
Radial Site Care ° °This sheet gives you information about how to care for yourself after your procedure. Your health care provider may also give you more specific instructions. If you have problems or questions, contact your health care provider. °What can I expect after the procedure? °After the procedure, it is common to have: °· Bruising and tenderness at the catheter insertion area. °Follow these instructions at home: °Medicines °· Take over-the-counter and prescription medicines only as told by your health care provider. °Insertion site care °· Follow instructions from your health care provider about how to take care of your insertion site. Make sure you: °? Wash your hands with soap and water before you change your bandage (dressing). If soap and water are not available, use hand sanitizer. °? Change your dressing as told by your health care provider. °? Leave stitches (sutures), skin glue, or adhesive strips in place. These skin closures may need to stay in place for 2 weeks or longer. If adhesive strip edges start to loosen and curl up, you may trim the loose edges. Do not remove adhesive strips completely unless your health care provider tells you to do that. °· Check your insertion site every day for signs of infection. Check for: °? Redness, swelling, or pain. °? Fluid or blood. °? Pus or a bad smell. °? Warmth. °· Do not take baths, swim, or use a hot tub until your health care provider approves. °· You may shower 24-48 hours after the procedure, or as directed by your health care provider. °? Remove the dressing and gently wash the site with plain soap and water. °? Pat the area dry with a clean towel. °? Do not rub the site. That could cause bleeding. °· Do not apply powder or lotion to the site. °Activity ° °· For 24 hours after the procedure, or as directed by your health care provider: °? Do not flex or bend the affected arm. °? Do not push or pull heavy objects with the affected arm. °? Do not  drive yourself home from the hospital or clinic. You may drive 24 hours after the procedure unless your health care provider tells you not to. °? Do not operate machinery or power tools. °· Do not lift anything that is heavier than 10 lb (4.5 kg), or the limit that you are told, until your health care provider says that it is safe. °· Ask your health care provider when it is okay to: °? Return to work or school. °? Resume usual physical activities or sports. °? Resume sexual activity. °General instructions °· If the catheter site starts to bleed, raise your arm and put firm pressure on the site. If the bleeding does not stop, get help right away. This is a medical emergency. °· If you went home on the same day as your procedure, a responsible adult should be with you for the first 24 hours after you arrive home. °· Keep all follow-up visits as told by your health care provider. This is important. °Contact a health care provider if: °· You have a fever. °· You have redness, swelling, or yellow drainage around your insertion site. °Get help right away if: °· You have unusual pain at the radial site. °· The catheter insertion area swells very fast. °· The insertion area is bleeding, and the bleeding does not stop when you hold steady pressure on the area. °· Your arm or hand becomes pale, cool, tingly, or numb. °These symptoms may represent a serious problem   that is an emergency. Do not wait to see if the symptoms will go away. Get medical help right away. Call your local emergency services (911 in the [place] Do not drive yourself to the hospital. °Summary °· After the procedure, it is common to have bruising and tenderness at the site. °· Follow instructions from your health care provider about how to take care of your radial site wound. Check the wound every day for signs of infection. °· Do not lift anything that is heavier than 10 lb (4.5 kg), or the limit that you are told, until your health care provider says  that it is safe. °This information is not intended to replace advice given to you by your health care provider. Make sure you discuss any questions you have with your health care provider. °Document Released: 01/10/2011 Document Revised: 01/13/2018 Document Reviewed: 01/13/2018 °Elsevier Interactive Patient Education © 2019 [employer] ° °

## 2019-03-02 NOTE — Progress Notes (Signed)
Progress Note  Patient Name: Derrick Stark Date of Encounter: 03/02/2019  Primary Cardiologist: New (From [facility])  Subjective   Feels well. No chest pain or dyspnea. No recurrent syncope.   Inpatient Medications    Scheduled Meds: . aspirin EC  81 mg Oral Daily  . carvedilol  3.125 mg Oral BID WC  . cholecalciferol  1,000 Units Oral Daily  . clopidogrel  75 mg Oral Daily  . fluticasone  2 spray Each Nare Daily  . losartan  25 mg Oral Daily  . multivitamin with minerals  1 tablet Oral Daily  . rosuvastatin  20 mg Oral q1800  . sodium chloride flush  3 mL Intravenous Q12H   Continuous Infusions: . sodium chloride     PRN Meds: sodium chloride, acetaminophen, alum & mag hydroxide-simeth, ondansetron (ZOFRAN) IV, sodium chloride flush   Vital Signs    Vitals:   03/01/19 2104 03/01/19 2108 03/01/19 2333 03/02/19 0324  BP: 107/65   101/61  Pulse: (!) 101  (!) 107 78  Resp: 20 (!) 31 18 20   Temp: 99.4 F (37.4 C)   98.6 F (37 C)  TempSrc: Oral   Oral  SpO2: 100%  97% 99%  Weight:    109 kg  Height:        Intake/Output Summary (Last 24 hours) at 03/02/2019 0942 Last data filed at 03/01/2019 1700 Gross per 24 hour  Intake 240 ml  Output 460 ml  Net -220 ml   Last 3 Weights 03/02/2019 03/01/2019 02/28/2019  Weight (lbs) 240 lb 4.8 oz 238 lb 1.6 oz 242 lb 4.8 oz  Weight (kg) 108.999 kg 108 kg 109.907 kg      Telemetry    NSR. One PVC triplet  - Personally Reviewed  ECG    None today- Personally Reviewed  Physical Exam   GEN: moderately obese middle aged AAM in No acute distress.   Neck: No JVD Cardiac: RRR, no murmurs, rubs, or gallops.  Respiratory: Clear to auscultation bilaterally. GI: Soft, nontender, non-distended  MS: No edema; No deformity. No radial site hematoma Neuro:  Nonfocal  Psych: Normal affect   Labs    Chemistry Recent Labs  Lab 02/27/19 0952  02/28/19 0611 03/01/19 0321 03/02/19 0420  NA 157*   < > 143 140 139  K 2.6*    < > 3.7 3.2* 3.5  CL 94*   < > 111 107 107  CO2 17*   < > 24 24 24   GLUCOSE 116*   < > 99 93 113*  BUN 15   < > 13 14 14   CREATININE 0.80   < > 0.89 1.18 0.99  CALCIUM 5.8*   < > 8.6* 8.8* 8.5*  PROT 5.0*  --   --   --   --   ALBUMIN 2.9*  --   --   --   --   AST 34  --   --   --   --   ALT 41  --   --   --   --   ALKPHOS 50  --   --   --   --   BILITOT 0.6  --   --   --   --   GFRNONAA >60   < > >60 >60 >60  GFRAA >60   < > >60 >60 >60  ANIONGAP 46*   < > 8 9 8    < > = values in this interval not displayed.  Hematology Recent Labs  Lab 02/27/19 1836 02/28/19 0611 03/01/19 0321  WBC 8.6 8.2 10.1  RBC 4.15*  4.15* 4.15* 3.96*  HGB 11.5* 11.5* 11.3*  HCT 35.5* 36.0* 34.1*  MCV 85.5 86.7 86.1  MCH 27.7 27.7 28.5  MCHC 32.4 31.9 33.1  RDW 13.1 13.2 13.2  PLT 231 225 236    Cardiac Enzymes Recent Labs  Lab 02/27/19 1112 02/27/19 1836 02/28/19 0022 02/28/19 0611  TROPONINI 0.49* 1.74* 1.26* 1.01*    Recent Labs  Lab 02/27/19 0953  TROPIPOC 0.21*     BNP Recent Labs  Lab 03/01/19 0321  BNP 196.3*     DDimer No results for input(s): DDIMER in the last 168 hours.   Radiology    No results found.  Cardiac Studies   2D Echo 02/27/19 1. The left ventricle has mild-moderately reduced systolic function, with an ejection fraction of 40-45%. The cavity size was normal. Left ventricular diastolic Doppler parameters are consistent with pseudonormalization Elevated left ventricular  end-diastolic pressure Left ventricular diffuse hypokinesis.  2. The right ventricle has normal systolic function. The cavity was normal. There is no increase in right ventricular wall thickness.  3. Right atrial size was mildly dilated.  4. The mitral valve is normal in structure.  5. The tricuspid valve is normal in structure.  6. The aortic valve is tricuspid.  7. The pulmonic valve was normal in structure.  8. The inferior vena cava was dilated in size with >50% respiratory  variability.  SUMMARY   Mild diffuse hypokinesis with LVEF 45-50%. Grade 2 diastolic dysfunction with elevated filling pressures.  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  Suggestion of left to right collaterals from the apical LAD.  Mid RCA contains eccentric 60% narrowing.  Cannot exclude ruptured plaque with distal embolization.  Normal left main  Normal LAD  Normal circumflex  Overall low normal LVEF 50%.  LVEDP 23 mmHg.  Consistent with diastolic heart failure.  Orthopnea and cough  RECOMMENDATIONS:   Antiplatelet therapy.  Consider Plavix and aspirin.  Risk factor modification with lipid lowering, blood pressure control, evaluation of glycemic control, sleep apnea evaluation, weight loss, moderate exercise.  Furosemide, 20 mg IV given in Cath Lab due to orthopnea and cough related to supine position.      Patient Profile     Derrick Stark is a 58 y.o. male with a hx of syncope, HTN, HLD, obesity and OSA who is being seen by cardiology for the evaluation of syncope, chest tightness and elevated troponin at the request of Dr. Jeraldine Loots. Also found to have reduced EF on echo, 40-45%. He lives in [place] and came to [place] for a [employer]	[place] tournament and passed out at his hotel 02/27/19.   Per pt report, he had a similar event in Sep 2019 w/ syncope and CP. Evaluated in [place]. Reports he had a cardiac cath that was normal and was told syncope was related to severe dehydration and hypokalemia.   Assessment & Plan    1. NSTEMI: presented w/ syncope and CP. Troponin peaked at 1.74. Echo with reduced EF at 40-45% w/ diffuse hypokinesis. Apparently EF normal in September in [place].   Reports he had a cardiac cath that was normal and was told syncope was related to severe dehydration and hypokalemia. Now with findings on cath of distal PDA occlusion likely embolic. Suspect plaque erosion in the Mid RCA where he has some  nonobstructive disease. Recommend  medical management. Add Plavix for one year. On beta blocker and statin.  We did request copy of cath films from [place] to review but they will not be here until later today.  2. Syncope:  Suspect dehydration as well as ACS. Hypokalemic. Now improved.   3. HTN: HCTZ stopped due to hypokalemia. On ARB and Coreg.   4. HLD: on simvastatin as outpatient. Lipid panel  showed LDL at 91. With ACS would recommend goal of LDL <70. Will switch zocor to Crestor. 20 mg daily.   5. Anemia: Hgb 11.5 (was reported as 9.6 on admit, but suspect possible lab error). MCV 88. FOBT ordered but not yet collected. Vit B 12 low. Folate normal. Ferritin WNL. He denies melena and hematochezia. Reports he had a colonoscopy 2 years ok that was "ok'.  6. Hypokalemia: 2.6 on admit. In the setting of diarrhea. 3.2 today. Did receive a dose of lasix yesterday. Will replete this am. Should improve with stopping HCTZ.   Patient is stable for DC from a cardiac standpoint. Will DC home today. Recommend cardiology follow up in [place] in 2-3 weeks.   For questions or updates, please contact [facility] Please consult [email or web address] for contact info under        Signed, Lerae Langham [place], MD  03/02/2019, 9:42 AM

## 2019-03-02 NOTE — Discharge Summary (Signed)
Discharge Summary    Patient ID: Derrick Stark MRN: [id number]; DOB: [birth year]  Admit date: 02/27/2019 Discharge date: 03/02/2019  Primary Care Provider: System, Pcp Not In  Primary Cardiologist: [facility] in [place], [place] Primary Electrophysiologist:  None   Discharge Diagnoses    Principal Problem:   Non-ST elevation (NSTEMI) myocardial infarction [facility]) Active Problems:   Syncope   Elevated troponin   Hypotension   Near syncope   Hypokalemia   Unstable angina pectoris (HCC)   Allergies No Known Allergies  Diagnostic Studies/Procedures    LEFT HEART CATH AND CORONARY ANGIOGRAPHY  02/28/2019  Conclusion   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  Suggestion of left to right collaterals from the apical LAD.  Mid RCA contains eccentric 60% narrowing.  Cannot exclude ruptured plaque with distal embolization.  Normal left main  Normal LAD  Normal circumflex  Overall low normal LVEF 50%.  LVEDP 23 mmHg.  Consistent with diastolic heart failure.  Orthopnea and cough  RECOMMENDATIONS:   Antiplatelet therapy.  Consider Plavix and aspirin.  Risk factor modification with lipid lowering, blood pressure control, evaluation of glycemic control, sleep apnea evaluation, weight loss, moderate exercise.  Furosemide, 20 mg IV given in Cath Lab due to orthopnea and cough related to supine position.  Recommendations   Antiplatelet/Anticoag Recommend Aspirin  daily for moderate CAD.  Diagnostic  Dominance: Right   _____________  Echocardiogram 02/27/2019 IMPRESSIONS  1. The left ventricle has mild-moderately reduced systolic function, with an ejection fraction of 40-45%. The cavity size was normal. Left ventricular diastolic Doppler parameters are consistent with pseudonormalization Elevated left ventricular  end-diastolic pressure Left ventricular diffuse hypokinesis.  2. The right ventricle has normal systolic function. The cavity was normal. There is no  increase in right ventricular wall thickness.  3. Right atrial size was mildly dilated.  4. The mitral valve is normal in structure.  5. The tricuspid valve is normal in structure.  6. The aortic valve is tricuspid.  7. The pulmonic valve was normal in structure.  8. The inferior vena cava was dilated in size with >50% respiratory variability.  SUMMARY Mild diffuse hypokinesis with LVEF 45-50%. Grade 2 diastolic dysfunction with elevated filling pressures.  FINDINGS  Left Ventricle: The left ventricle has mild-moderately reduced systolic function, with an ejection fraction of 40-45%. The cavity size was normal. There is no increase in left ventricular wall thickness. Left ventricular diastolic Doppler parameters are  consistent with pseudonormalization Elevated left ventricular end-diastolic pressure Left ventricular diffuse hypokinesis. Right Ventricle: The right ventricle has normal systolic function. The cavity was normal. There is no increase in right ventricular wall thickness. Left [facility]: left atrial size was normal in size Right [facility]: right atrial size was mildly dilated. Interatrial Septum: No atrial level shunt detected by color flow Doppler. Pericardium: There is no evidence of pericardial effusion. Mitral Valve: The mitral valve is normal in structure. Mitral valve regurgitation is mild by color flow Doppler. Tricuspid Valve: The tricuspid valve is normal in structure. Tricuspid valve regurgitation was not visualized by color flow Doppler. Aortic Valve: The aortic valve is tricuspid Aortic valve regurgitation was not visualized by color flow Doppler. Pulmonic Valve: The pulmonic valve was normal in structure. Pulmonic valve regurgitation is not visualized by color flow Doppler. Venous: The inferior vena cava is dilated in size with greater than 50% respiratory variability.     History of Present Illness     [name] is a 58 y.o. male with a hx of  syncope, HTN, HLD,  obesity and OSA who is being seen by cardiology for the evaluation of syncope, chest tightness and elevated troponin.   [name] is here for a [employer] and passed out at his hotel today. He lives in [place]. He reports that in September 2019 he had a similar episode of dizziness then syncope with reported negative cardiac workup at that time. He recalls having a heart cath and echo and being told everything was normal. He was told he was extremely dehydrated and advised to do PT and f/u with his PCP. He did not have any cardiac follow-up recommended. He has done well since then and is pretty active. 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 Yesterday he attended a daylong basketball session in the gym with little water, only a few soft drinks. This morning he walked out to his car and suddenly felt some substernal chest tightness and SOB. He felt like he needed to sit down and so went to sit down on a bench where chest discomfort felt better but that's the last thing he remembers until he woke up with people around him. No b/b incontinence reported. He is not sure how long he was out for. He did not require any CPR or defibrillation. Per EMS run sheet, "Bystanders are assistance coaches who are traveling with PT, in town for a basketball tournament. Bystanders deny PT fell or suffered trauma of any kind." They reported he was out for 2-3 minutes. He has been nauseous since regaining consciousness. He was reportedly hypotensive and received 250cc bolus with run sheet showing first BP of 118/77. His initial BP here was 91/63. His initial EKG showed by EMS showed NSR with downsloped ST segment depression inferiorly as well as V3-V6, much more pronounced than our ER tracing, QTc (see Chart Review -> Media -> EMS Run Sheet) - f/u tracing here shows NSR with borderline inferior ST sagging and V6,  QTc .    Hospital Course     Consultants: None  1. NSTEMI: presented w/ syncope and CP. Troponin peaked at 1.74. Echo with reduced EF at 40-45% w/ diffuse hypokinesis. Apparently EF normal in September in [place].   Reports he had a cardiac cath that was normal and was told syncope was related to severe dehydration and hypokalemia. Now with findings on cath, 02/28/2019, of distal PDA occlusion likely embolic. Suspect plaque erosion in the Mid RCA where he has some nonobstructive disease. Recommend medical management. Add Plavix for one year. On beta blocker and statin.  We did request copy of cath films from [place] to review but they will not be here until later today. Will refer pt to CR phase 2 in [place].   2. Syncope:  Suspect dehydration as well as ACS. Hypokalemic. Now improved.   3. HTN: HCTZ stopped due to hypokalemia. On ARB and Coreg.   4. HLD: on simvastatin as outpatient. Lipid panel  showed LDL at 91. With ACS would recommend goal of LDL <70. Will switch zocor to Crestor. 20 mg daily.   5. Anemia: Hgb 11.5 (was reported as 9.6 on admit, but suspect possible lab error). MCV 88. FOBT ordered but not yet collected. Vit B 12 low. Folate normal. Ferritin WNL. He denies melena and hematochezia. Reports he had a colonoscopy 2 years ok that was "ok'.  6. Hypokalemia: 2.6 on admit. In the setting of diarrhea. 3.2 today. Did  receive a dose of lasix yesterday. Will replete this am. Should improve with stopping HCTZ.   Patient seen by Dr. [place] and is stable for DC from a cardiac standpoint. Will DC home today. Recommend cardiology follow up in [place] in 2-3 weeks. Pt states that he will call for appt once he gets home as he has the business card with phone number.  _____________  Discharge Vitals Blood pressure 101/61, pulse 78, temperature 98.6 F (37 C), temperature source Oral, resp. rate 20, height  (1.651 m), weight 109 kg, SpO2 99 %.  Filed Weights    02/28/19 0513 03/01/19 0621 03/02/19 0324  Weight: 109.9 kg 108 kg 109 kg    Labs & Radiologic Studies    CBC Recent Labs    02/28/19 0611 03/01/19 0321  WBC 8.2 10.1  HGB 11.5* 11.3*  HCT 36.0* 34.1*  MCV 86.7 86.1  PLT 225 236   Basic Metabolic Panel Recent Labs    16/10/96 0321 03/02/19 0420  NA 140 139  K 3.2* 3.5  CL 107 107  CO2 24 24  GLUCOSE 93 113*  BUN 14 14  CREATININE 1.18 0.99  CALCIUM 8.8* 8.5*  MG 1.7 1.6*   Liver Function Tests No results for input(s): AST, ALT, ALKPHOS, BILITOT, PROT, ALBUMIN in the last 72 hours. No results for input(s): LIPASE, AMYLASE in the last 72 hours. Cardiac Enzymes Recent Labs    02/27/19 1836 02/28/19 0022 02/28/19 0611  TROPONINI 1.74* 1.26* 1.01*   BNP Invalid input(s): POCBNP D-Dimer No results for input(s): DDIMER in the last 72 hours. Hemoglobin A1C No results for input(s): HGBA1C in the last 72 hours. Fasting Lipid Panel Recent Labs    02/28/19 0022  CHOL 137  HDL 35*  LDLCALC 91  TRIG 55  CHOLHDL 3.9   Thyroid Function Tests Recent Labs    02/27/19 1836  TSH 0.931   _____________  Dg Chest 2 View  Result Date: 02/27/2019 CLINICAL DATA:  Syncope and chest pain EXAM: CHEST - 2 VIEW COMPARISON:  None. FINDINGS: Low volume chest with mild interstitial crowding/hazy density at the bases. There is no edema, consolidation, effusion, or pneumothorax. Normal heart size and mediastinal contours. No acute osseous finding IMPRESSION: Low volume chest with presumed mild atelectasis. Electronically Signed   By: Marnee Spring M.D.   On: 02/27/2019 10:43   Disposition   Pt is being discharged home today in good condition.  Follow-up Plans & Appointments    Follow-up Information    [facility]. Schedule an appointment as soon as possible for a visit.   Why:  Follow up with your cardiologist in 2-3 weeks.          Discharge Instructions    Diet - low sodium heart healthy   Complete by:  As  directed    Discharge instructions   Complete by:  As directed    PLEASE REMEMBER TO BRING ALL OF YOUR MEDICATIONS TO EACH OF YOUR FOLLOW-UP OFFICE VISITS.  PLEASE ATTEND ALL SCHEDULED FOLLOW-UP APPOINTMENTS.   Activity: Increase activity slowly as tolerated. You may shower, but no soaking baths (or swimming) for 1 week. No driving for 48 hours. No lifting over 5 lbs for 1 week. No sexual activity for 1 week.   You May Return to Work: 03/07/2019 (if applicable)  Wound Care: You may wash cath site gently with soap and water. Keep cath site clean and dry. If you notice pain, swelling, bleeding or pus at your cath site,  please call [phone].   Increase activity slowly   Complete by:  As directed       Discharge Medications   Allergies as of 03/02/2019   No Known Allergies     Medication List    STOP taking these medications   hydrochlorothiazide 25 MG tablet Commonly known as:  HYDRODIURIL   simvastatin 20 MG tablet Commonly known as:  ZOCOR     TAKE these medications   aspirin EC 81 MG tablet Take 81 mg by mouth daily.   carvedilol 3.125 MG tablet Commonly known as:  COREG Take 1 tablet (3.125 mg total) by mouth 2 (two) times daily with a meal.   cholecalciferol 25 MCG (1000 UT) tablet Commonly known as:  VITAMIN D3 Take 1,000 Units by mouth daily.   clopidogrel 75 MG tablet Commonly known as:  PLAVIX Take 1 tablet (75 mg total) by mouth daily.   fluticasone 50 MCG/ACT nasal spray Commonly known as:  FLONASE Place 2 sprays into both nostrils daily.   HYDROcodone-acetaminophen 10-325 MG tablet Commonly known as:  NORCO Take 1 tablet by mouth every 8 (eight) hours as needed for pain. for pain   losartan 25 MG tablet Commonly known as:  COZAAR Take 1 tablet (25 mg total) by mouth daily. What changed:    medication strength  how much to take   Misc. Devices Misc 1 each by Does not apply route at bedtime. C-PAP   multivitamin with minerals tablet Take  1 tablet by mouth daily.   rosuvastatin 20 MG tablet Commonly known as:  CRESTOR Take 1 tablet (20 mg total) by mouth daily at 6 PM.        Acute coronary syndrome (MI, NSTEMI, STEMI, etc) this admission?: Yes.     AHA/ACC Clinical Performance & Quality Measures: 1. Aspirin prescribed? - Yes 2. ADP Receptor Inhibitor (Plavix/Clopidogrel, Brilinta/Ticagrelor or Effient/Prasugrel) prescribed (includes medically managed patients)? - Yes 3. Beta Blocker prescribed? - Yes 4. High Intensity Statin (Lipitor 40-80mg  or Crestor 20-40mg ) prescribed? - Yes 5. EF assessed during THIS hospitalization? - Yes 6. For EF <40%, was ACEI/ARB prescribed? - Not Applicable (EF >/= 40%) 7. For EF <40%, Aldosterone Antagonist (Spironolactone or Eplerenone) prescribed? - Not Applicable (EF >/= 40%) 8. Cardiac Rehab Phase II ordered (Included Medically managed Patients)? - yes     Outstanding Labs/Studies   Follow up labs at office visit.   Duration of Discharge Encounter   Greater than 30 minutes including physician time.  Signed, Berton Bon, NP 03/02/2019, 10:09 AM

## 2019-03-02 NOTE — Progress Notes (Signed)
CARDIAC REHAB PHASE I   PRE:  Rate/Rhythm: 74 SR  BP:  Supine:  Sitting: 94/59 left arm  Standing:    SaO2: 98%RA  MODE:  Ambulation: 420 ft   POST:  Rate/Rhythm: 115 ST  BP:  Supine:   Sitting: left arm 79/39  right arm 121/67 Standing:    SaO2: 99%RA 1040-1142 Pt walked 420 ft on RA with steady gait and no CP and tolerated well. Denied dizziness with BP readings after walk. BP much higher in right arm. Notified cardiology of BPs after walk. Pt asymptomatic. MI education completed with pt who voiced understanding. Stressed importance of MI restrictions, heart healthy food choices, NTG use( if ordered.. discussed with pt may not be ordered with the low BP and coming in with syncope).  Also gave ex ed and discussed CRP 2. Will refer to [facility] CRP 2 program in [place].   Luetta Nutting, RN BSN  03/02/2019 11:37 AM

## 2019-05-19 ENCOUNTER — Telehealth: Payer: Self-pay | Admitting: Cardiology

## 2019-05-19 MED ORDER — CLOPIDOGREL BISULFATE 75 MG PO TABS: 75.0000 mg | ORAL_TABLET | Freq: Every day | ORAL | 0 refills | Status: AC

## 2019-05-19 NOTE — Telephone Encounter (Signed)
Lenn Sink, CMA, Tresa Endo, RN, wanted me to reach out to you to try and get this pt on Derrick Hammond,NP schedule, pt was seen in the hospital in March and was supposed to F/U with the office in 2-3 weeks and Kelly,RN stated that Derrick Hammond,NP has opening. Please address

## 2019-05-19 NOTE — Telephone Encounter (Signed)
Attempted to reach pt to see if I could get him scheduled with Lizabeth Leyden, NP on 6/1 or 6/2. No answer. Lvm

## 2019-05-25 NOTE — Telephone Encounter (Signed)
Attempted to reach pt to see if he would like a virtual visit with Lizabeth Leyden, NP on 05/30/2019

## 2020-09-26 IMAGING — DX DG CHEST 2V
2 series · 2 of 2 positions shown · non-contrast
Comparison: None.

CLINICAL DATA: Syncope and chest pain

EXAM:
CHEST - 2 VIEW

[x chest ap]
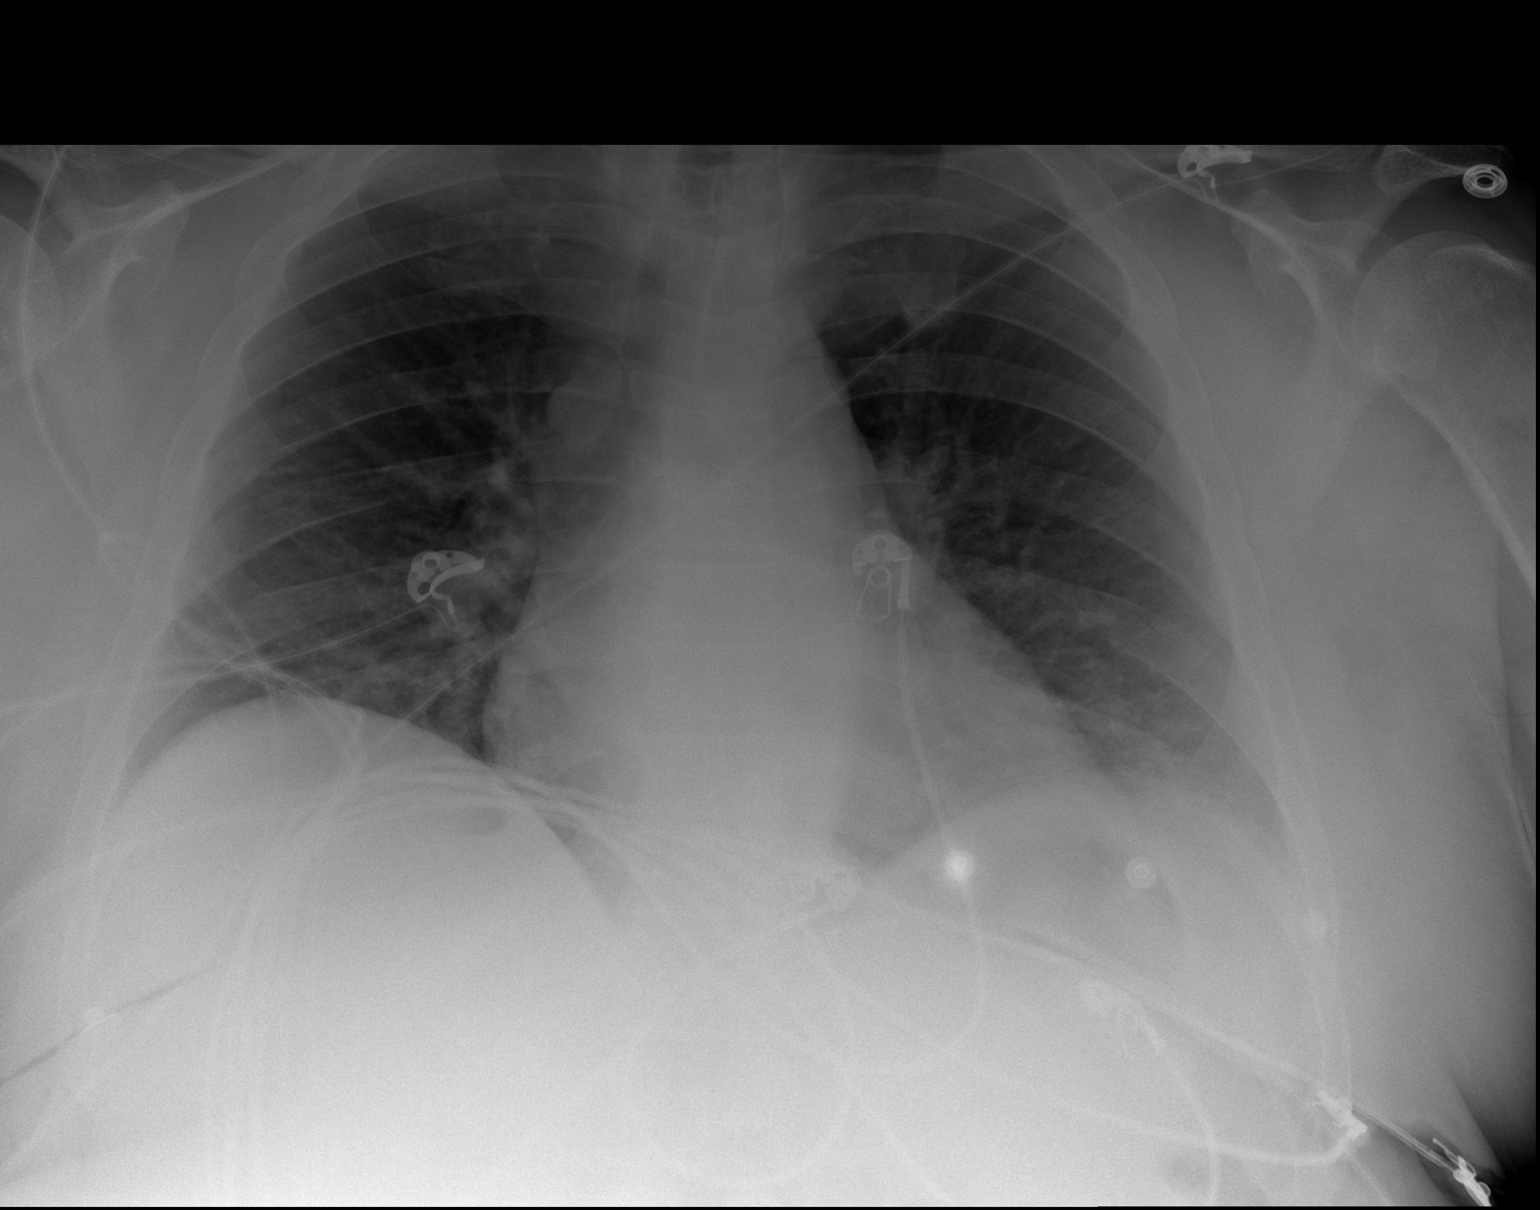

[w chest lat]
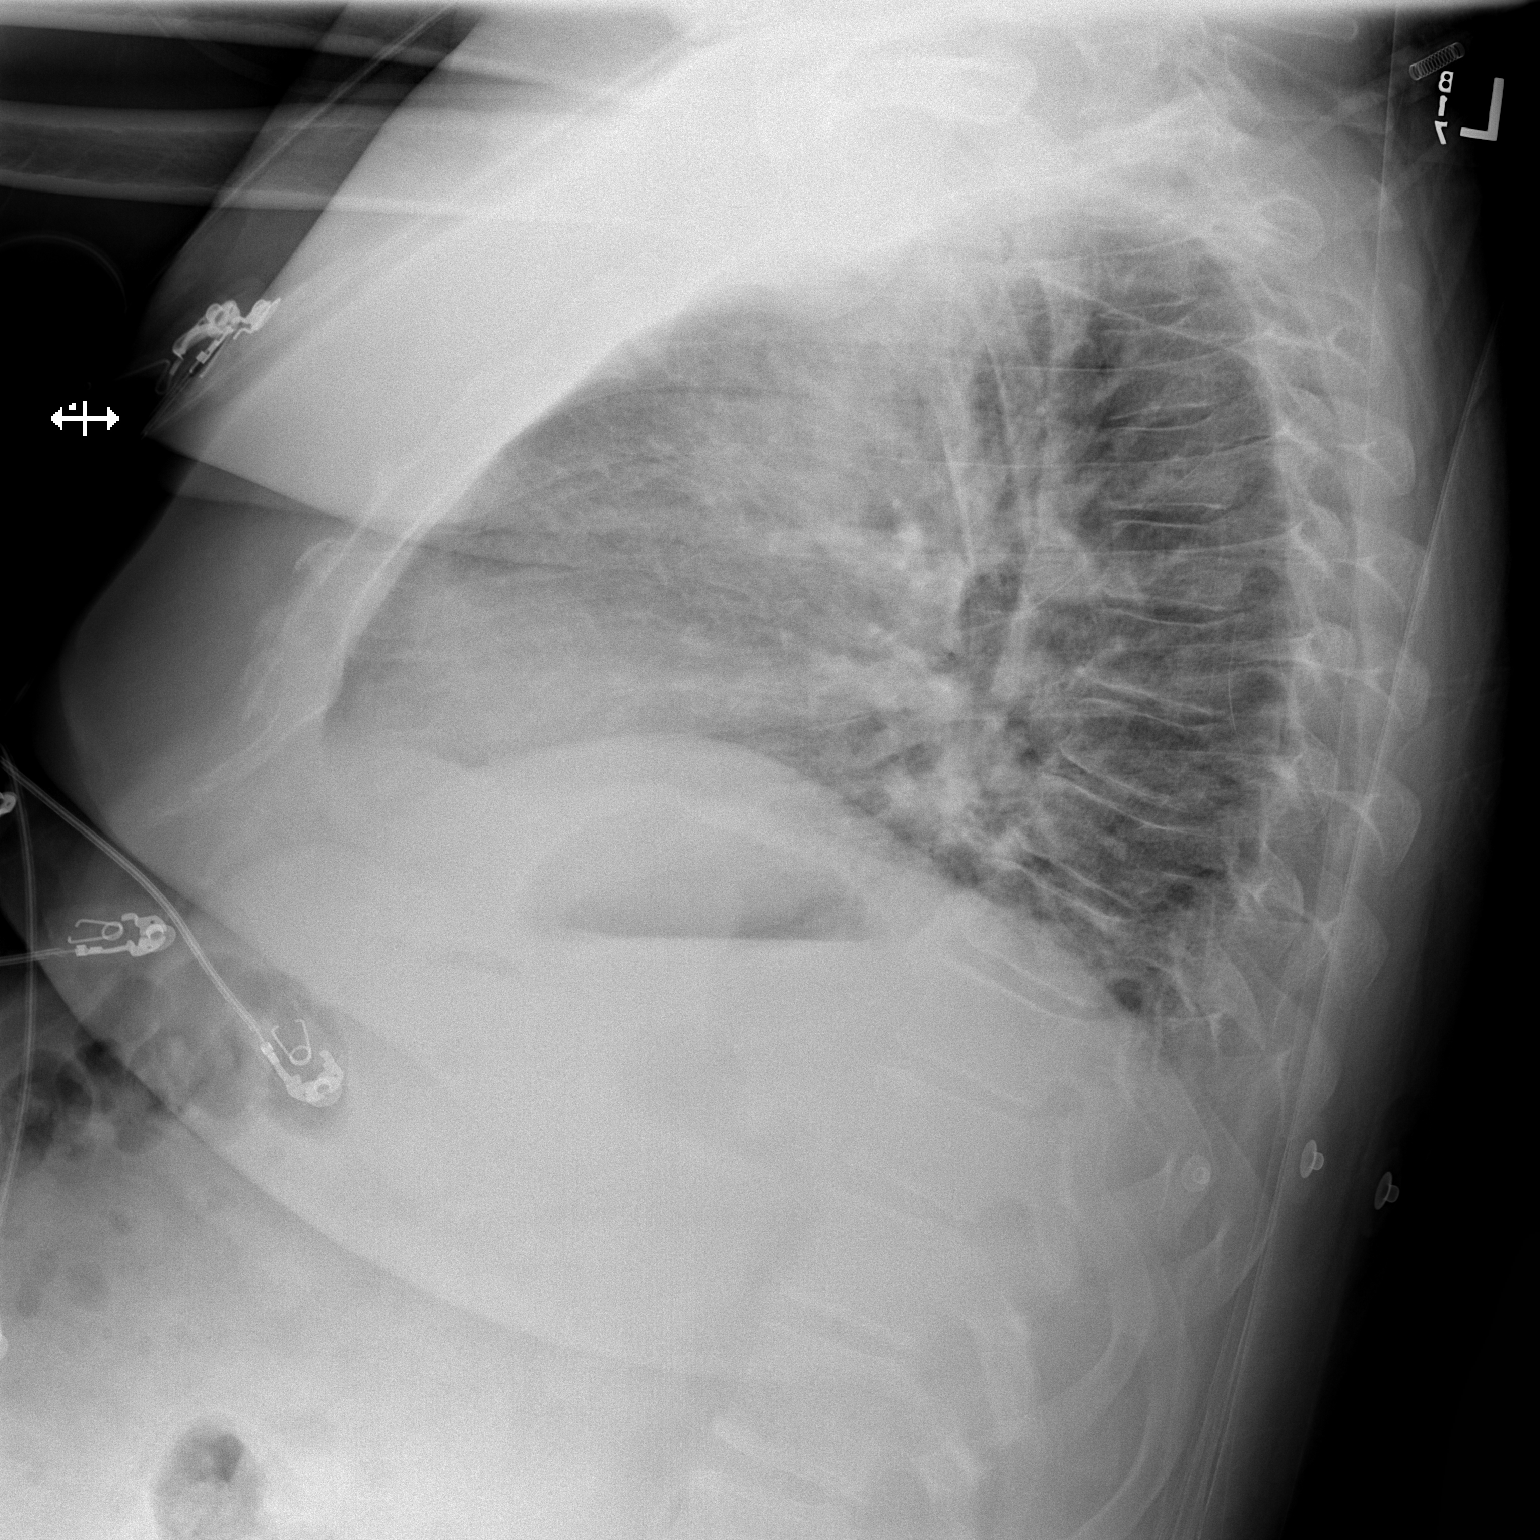

[2 of 2 positions shown; findings below may reference images not displayed]

FINDINGS: Low volume chest with mild interstitial crowding/hazy density at the
bases. There is no edema, consolidation, effusion, or pneumothorax.
Normal heart size and mediastinal contours. No acute osseous finding
IMPRESSION: Low volume chest with presumed mild atelectasis.
